# Patient Record
Sex: Male | Born: 1960 | Race: White | Hispanic: No | Marital: Single | Smoking: Former smoker
Health system: Southern US, Community
[De-identification: ages and names within clinical notes are randomized; demographics above are authoritative.]

## PROBLEM LIST (undated history)

## (undated) DIAGNOSIS — I10 Essential (primary) hypertension: Secondary | ICD-10-CM

## (undated) HISTORY — PX: FEMUR FRACTURE SURGERY: SHX633

## (undated) HISTORY — DX: Essential (primary) hypertension: I10

---

## 2012-02-23 ENCOUNTER — Ambulatory Visit (INDEPENDENT_AMBULATORY_CARE_PROVIDER_SITE_OTHER): Payer: BC Managed Care – PPO | Admitting: Family Medicine

## 2012-02-23 VITALS — BP 143/84 | HR 64 | Temp 98.3°F | Resp 18 | Ht 71.5 in | Wt 279.4 lb

## 2012-02-23 DIAGNOSIS — I10 Essential (primary) hypertension: Secondary | ICD-10-CM

## 2012-02-23 DIAGNOSIS — E789 Disorder of lipoprotein metabolism, unspecified: Secondary | ICD-10-CM

## 2012-02-23 DIAGNOSIS — E785 Hyperlipidemia, unspecified: Secondary | ICD-10-CM

## 2012-02-23 MED ORDER — METOPROLOL TARTRATE 50 MG PO TABS: 50.0000 mg | ORAL_TABLET | Freq: Two times a day (BID) | ORAL | Status: DC

## 2012-02-23 MED ORDER — LISINOPRIL-HYDROCHLOROTHIAZIDE 20-12.5 MG PO TABS: 2.0000 | ORAL_TABLET | Freq: Every day | ORAL | Status: DC

## 2012-02-23 MED ORDER — PRAVASTATIN SODIUM 20 MG PO TABS: 20.0000 mg | ORAL_TABLET | Freq: Every day | ORAL | Status: DC

## 2012-02-23 NOTE — Patient Instructions (Signed)
Continue same meds for now.  Work on weight loss with continuing exercise and diet changes.  Check blood pressures outside of office every 1-2 weeks.  If blood pressures remain over 140/90, may need to change your meds.   Follow up for physical in next 6 months.

## 2012-02-23 NOTE — Progress Notes (Signed)
  Subjective:    Patient ID: Jonathon Fischer, male    DOB: [birth year], 51 y.o.   MRN: [id number]  HPI Jonathon Fischer is a 51 y.o. 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 Weight up few pounds since last office visit.  Walking for exercise - few times per week.  Plans on new diet. Trying to lose weight.  Hyperlipidemia - no new side effects of statin, No new myalgias.  No recent knee pain.    Last po at 2 am.   Review of Systems  Constitutional: Negative for fatigue and unexpected weight change.  Eyes: Negative for visual disturbance.  Respiratory: Negative for cough, chest tightness and shortness of breath.   Cardiovascular: Negative for chest pain, palpitations and leg swelling.  Gastrointestinal: Negative for abdominal pain and blood in stool.  Musculoskeletal: Negative for myalgias and arthralgias.  Neurological: Negative for dizziness, light-headedness and headaches.       Objective:   Physical Exam  Constitutional: He is oriented to person, place, and time. He appears well-developed and well-nourished.  HENT:  Head: Normocephalic and atraumatic.  Eyes: EOM are normal. Pupils are equal, round, and reactive to light.  Neck: No JVD present. Carotid bruit is not present.  Cardiovascular: Normal rate, regular rhythm and normal heart sounds.   No murmur heard. Pulmonary/Chest: Effort normal and breath sounds normal. He has no rales.  Musculoskeletal: He exhibits no edema.  Neurological: He is alert and oriented to person, place, and time.  Skin: Skin is warm and dry.  Psychiatric: He has a normal mood and affect.          Assessment & Plan:  Jonathon Fischer is a 51 y.o. male 1. HTN (hypertension)  Comprehensive metabolic panel, Lipid panel  2. Hyperlipidemia  Comprehensive metabolic panel, Lipid panel   Borderline HTN - discussed overweight and need for weight loss and diet change.  If blood pressures are continuing to  be elevated, RTC for med change.  Check CMP, lipids.  Cont same med doses.

## 2012-02-24 LAB — COMPREHENSIVE METABOLIC PANEL
ALT: 28 U/L (ref 0–53)
AST: 20 U/L (ref 0–37)
Albumin: 4.9 g/dL (ref 3.5–5.2)
Alkaline Phosphatase: 34 U/L — ABNORMAL LOW (ref 39–117)
BUN: 18 mg/dL (ref 6–23)
CO2: 28 mEq/L (ref 19–32)
Calcium: 9.7 mg/dL (ref 8.4–10.5)
Chloride: 100 mEq/L (ref 96–112)
Creat: 1.1 mg/dL (ref 0.50–1.35)
Glucose, Bld: 93 mg/dL (ref 70–99)
Potassium: 4.5 mEq/L (ref 3.5–5.3)
Sodium: 138 mEq/L (ref 135–145)
Total Bilirubin: 0.7 mg/dL (ref 0.3–1.2)
Total Protein: 6.9 g/dL (ref 6.0–8.3)

## 2012-02-24 LAB — LIPID PANEL
Cholesterol: 220 mg/dL — ABNORMAL HIGH (ref 0–200)
HDL: 50 mg/dL (ref >39)
LDL Cholesterol: 131 mg/dL — ABNORMAL HIGH (ref 0–99)
Total CHOL/HDL Ratio: 4.4 Ratio
Triglycerides: 193 mg/dL — ABNORMAL HIGH (ref <150)
VLDL: 39 mg/dL (ref 0–40)

## 2012-08-07 ENCOUNTER — Other Ambulatory Visit: Payer: Self-pay | Admitting: Family Medicine

## 2012-09-07 ENCOUNTER — Ambulatory Visit: Payer: BC Managed Care – PPO | Admitting: Family Medicine

## 2012-09-07 VITALS — BP 132/86 | HR 54 | Temp 98.4°F | Resp 18 | Ht 72.75 in | Wt 267.0 lb

## 2012-09-07 DIAGNOSIS — I10 Essential (primary) hypertension: Secondary | ICD-10-CM

## 2012-09-07 DIAGNOSIS — E785 Hyperlipidemia, unspecified: Secondary | ICD-10-CM

## 2012-09-07 LAB — LIPID PANEL
Cholesterol: 156 mg/dL (ref 0–200)
HDL: 48 mg/dL (ref >39)
LDL Cholesterol: 82 mg/dL (ref 0–99)
Total CHOL/HDL Ratio: 3.3 Ratio
Triglycerides: 129 mg/dL (ref <150)
VLDL: 26 mg/dL (ref 0–40)

## 2012-09-07 LAB — COMPREHENSIVE METABOLIC PANEL
ALT: 25 U/L (ref 0–53)
AST: 21 U/L (ref 0–37)
Albumin: 4.6 g/dL (ref 3.5–5.2)
Alkaline Phosphatase: 31 U/L — ABNORMAL LOW (ref 39–117)
BUN: 20 mg/dL (ref 6–23)
CO2: 28 mEq/L (ref 19–32)
Calcium: 9.4 mg/dL (ref 8.4–10.5)
Chloride: 102 mEq/L (ref 96–112)
Creat: 0.99 mg/dL (ref 0.50–1.35)
Glucose, Bld: 89 mg/dL (ref 70–99)
Potassium: 4.7 mEq/L (ref 3.5–5.3)
Sodium: 138 mEq/L (ref 135–145)
Total Bilirubin: 0.6 mg/dL (ref 0.3–1.2)
Total Protein: 6.5 g/dL (ref 6.0–8.3)

## 2012-09-07 MED ORDER — METOPROLOL TARTRATE 50 MG PO TABS: ORAL_TABLET | ORAL | Status: DC

## 2012-09-07 MED ORDER — LISINOPRIL-HYDROCHLOROTHIAZIDE 20-12.5 MG PO TABS: 2.0000 | ORAL_TABLET | Freq: Every day | ORAL | Status: DC

## 2012-09-07 MED ORDER — PRAVASTATIN SODIUM 40 MG PO TABS: 40.0000 mg | ORAL_TABLET | Freq: Every day | ORAL | Status: DC

## 2012-09-07 NOTE — Patient Instructions (Addendum)
Continue same meds

## 2012-09-07 NOTE — Progress Notes (Signed)
Subjective:  Patient usually sees Dr. Chilton Si. He's here for followup of his blood pressure and cholesterol. He has no major acute complaints. Review of systems as per unremarkable. No chest pains or shortness of breath. He takes 40 mg of pravastatin since his last visit showed his cholesterol was still high.  Objective: TMs are occluded by wax on the left. On the right he has a hole in his TM. He apparently had tubes it came out several years ago. I suggested he consider seeing his ENT doctor back some time. Throat was clear. Neck supple without nodes or thyromegaly. Chest is clear to auscultation. Heart regular without murmurs gallops or arrhythmias. Abdomen soft. No ankle edema.  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

## 2012-10-10 ENCOUNTER — Other Ambulatory Visit: Payer: Self-pay | Admitting: Family Medicine

## 2013-03-02 ENCOUNTER — Ambulatory Visit (INDEPENDENT_AMBULATORY_CARE_PROVIDER_SITE_OTHER): Payer: BC Managed Care – PPO | Admitting: Family Medicine

## 2013-03-02 VITALS — BP 112/74 | HR 44 | Temp 98.5°F | Resp 18 | Ht 71.5 in | Wt 240.0 lb

## 2013-03-02 DIAGNOSIS — Z0000 Encounter for general adult medical examination without abnormal findings: Secondary | ICD-10-CM

## 2013-03-02 DIAGNOSIS — I498 Other specified cardiac arrhythmias: Secondary | ICD-10-CM

## 2013-03-02 DIAGNOSIS — R6889 Other general symptoms and signs: Secondary | ICD-10-CM

## 2013-03-02 DIAGNOSIS — Z1211 Encounter for screening for malignant neoplasm of colon: Secondary | ICD-10-CM

## 2013-03-02 DIAGNOSIS — E785 Hyperlipidemia, unspecified: Secondary | ICD-10-CM

## 2013-03-02 DIAGNOSIS — I10 Essential (primary) hypertension: Secondary | ICD-10-CM

## 2013-03-02 DIAGNOSIS — R001 Bradycardia, unspecified: Secondary | ICD-10-CM

## 2013-03-02 LAB — LIPID PANEL
Cholesterol: 150 mg/dL (ref 0–200)
HDL: 48 mg/dL (ref >39)
LDL Cholesterol: 83 mg/dL (ref 0–99)
Total CHOL/HDL Ratio: 3.1 Ratio
Triglycerides: 96 mg/dL (ref <150)
VLDL: 19 mg/dL (ref 0–40)

## 2013-03-02 LAB — COMPREHENSIVE METABOLIC PANEL
ALT: 28 U/L (ref 0–53)
AST: 27 U/L (ref 0–37)
Albumin: 4.3 g/dL (ref 3.5–5.2)
Alkaline Phosphatase: 28 U/L — ABNORMAL LOW (ref 39–117)
BUN: 27 mg/dL — ABNORMAL HIGH (ref 6–23)
CO2: 29 mEq/L (ref 19–32)
Calcium: 8.9 mg/dL (ref 8.4–10.5)
Chloride: 102 mEq/L (ref 96–112)
Creat: 0.82 mg/dL (ref 0.50–1.35)
Glucose, Bld: 93 mg/dL (ref 70–99)
Potassium: 4.4 mEq/L (ref 3.5–5.3)
Sodium: 137 mEq/L (ref 135–145)
Total Bilirubin: 0.5 mg/dL (ref 0.3–1.2)
Total Protein: 6.4 g/dL (ref 6.0–8.3)

## 2013-03-02 LAB — POCT CBC
Granulocyte percent: 66.6 %G (ref 37–80)
HCT, POC: 42.4 % — AB (ref 43.5–53.7)
Hemoglobin: 13.4 g/dL — AB (ref 14.1–18.1)
Lymph, poc: 1.7 (ref 0.6–3.4)
MCH, POC: 29.9 pg (ref 27–31.2)
MCHC: 31.6 g/dL — AB (ref 31.8–35.4)
MCV: 94.6 fL (ref 80–97)
MID (cbc): 0.6 (ref 0–0.9)
MPV: 9 fL (ref 0–99.8)
POC Granulocyte: 4.7 (ref 2–6.9)
POC LYMPH PERCENT: 24.8 %L (ref 10–50)
POC MID %: 8.6 %M (ref 0–12)
Platelet Count, POC: 172 10*3/uL (ref 142–424)
RBC: 4.48 M/uL — AB (ref 4.69–6.13)
RDW, POC: 13.3 %
WBC: 7 10*3/uL (ref 4.6–10.2)

## 2013-03-02 LAB — IFOBT (OCCULT BLOOD): IFOBT: NEGATIVE

## 2013-03-02 LAB — PSA: PSA: 1.14 ng/mL (ref <=4.00)

## 2013-03-02 LAB — TSH: TSH: 2.482 u[IU]/mL (ref 0.350–4.500)

## 2013-03-02 MED ORDER — PRAVASTATIN SODIUM 40 MG PO TABS: ORAL_TABLET | ORAL | Status: DC

## 2013-03-02 MED ORDER — LISINOPRIL-HYDROCHLOROTHIAZIDE 20-12.5 MG PO TABS: 2.0000 | ORAL_TABLET | Freq: Every day | ORAL | Status: DC

## 2013-03-02 NOTE — Progress Notes (Signed)
Subjective:    Patient ID: Jonathon Fischer, male    DOB: 1961-02-08, 52 y.o.   MRN: [id number]  HPI Jonathon Fischer is a 52 y.o. male Here for CPE.  Last ov in 08/2012.  Has not had colonoscopy.   No flu vaccine this year - never gets these.   last PSA 1.0 on 12/27/2009.   Has regular dentist - Q 6 months.   Results for orders placed in visit on 09/07/12  LIPID PANEL      Result Value Range   Cholesterol 156  0 - 200 mg/dL   Triglycerides 409  <811 mg/dL   HDL 48  >91 mg/dL   Total CHOL/HDL Ratio 3.3     VLDL 26  0 - 40 mg/dL   LDL Cholesterol 82  0 - 99 mg/dL  COMPREHENSIVE METABOLIC PANEL      Result Value Range   Sodium 138  135 - 145 mEq/L   Potassium 4.7  3.5 - 5.3 mEq/L   Chloride 102  96 - 112 mEq/L   CO2 28  19 - 32 mEq/L   Glucose, Bld 89  70 - 99 mg/dL   BUN 20  6 - 23 mg/dL   Creat 4.78  2.95 - 6.21 mg/dL   Total Bilirubin 0.6  0.3 - 1.2 mg/dL   Alkaline Phosphatase 31 (*) 39 - 117 U/L   AST 21  0 - 37 U/L   ALT 25  0 - 53 U/L   Total Protein 6.5  6.0 - 8.3 g/dL   Albumin 4.6  3.5 - 5.2 g/dL   Calcium 9.4  8.4 - 30.8 mg/dL   HTN - on metoprolol and zestoretic. No recent home Bp's. No lightheadedness. Occasionally dizzy before taking med, not after metoprolol. No cough.   Hyperlipidemia - prior labs as above.  Takes pravastatin 40mg  qd. Weight down from 267 in 08/2012. Has a treadmill.  Walking 5 days a week.also changing diet. Fasting this morning. No new myalgias.   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   Review of Systems  Constitutional: Negative for fever, chills, fatigue and unexpected weight change.  HENT: Positive for tinnitus (for years. has had hearing test at work that was normal.  hx of TM tube and remnant opening in TM.  no follow up. ).   Eyes: Negative for visual disturbance.  Respiratory: Negative for cough, chest tightness and shortness of breath.   Cardiovascular: Negative for chest pain, palpitations  and leg swelling.  Gastrointestinal: Negative for abdominal pain and blood in stool.  Neurological: Negative for light-headedness and headaches.  All other systems reviewed and are negative.       Objective:   Physical Exam  Vitals reviewed. Constitutional: He is oriented to person, place, and time. He appears well-developed and well-nourished.  HENT:  Head: Normocephalic and atraumatic.  Right Ear: External ear normal.  Left Ear: External ear normal.  Mouth/Throat: Oropharynx is clear and moist.  Cerumen nonobstructive bilat. Hole noted in R tm. No discharge.   Eyes: Conjunctivae and EOM are normal. Pupils are equal, round, and reactive to light.  Neck: Normal range of motion. Neck supple. No thyromegaly present.  Cardiovascular: Regular rhythm, normal heart sounds and intact distal pulses.   No extrasystoles are present. Bradycardia present.   Pulmonary/Chest: Effort normal and breath sounds normal. No respiratory distress. He has no wheezes.  Abdominal: Soft. He exhibits no distension. There is no tenderness.  Hernia confirmed negative in the right inguinal area and confirmed negative in the left inguinal area.  Genitourinary: Prostate normal.  Musculoskeletal: Normal range of motion. He exhibits no edema and no tenderness.  Lymphadenopathy:    He has no cervical adenopathy.  Neurological: He is alert and oriented to person, place, and time. He has normal reflexes.  Skin: Skin is warm and dry.  Psychiatric: He has a normal mood and affect. His behavior is normal.    EKG: SR, bradycardia - rate 53.  No apparent change from 12/2009  Results for orders placed in visit on 03/02/13  POCT CBC      Result Value Range   WBC 7.0  4.6 - 10.2 K/uL   Lymph, poc 1.7  0.6 - 3.4   POC LYMPH PERCENT 24.8  10 - 50 %L   MID (cbc) 0.6  0 - 0.9   POC MID % 8.6  0 - 12 %M   POC Granulocyte 4.7  2 - 6.9   Granulocyte percent 66.6  37 - 80 %G   RBC 4.48 (*) 4.69 - 6.13 M/uL   Hemoglobin 13.4  (*) 14.1 - 18.1 g/dL   HCT, POC 40.9 (*) 81.1 - 53.7 %   MCV 94.6  80 - 97 fL   MCH, POC 29.9  27 - 31.2 pg   MCHC 31.6 (*) 31.8 - 35.4 g/dL   RDW, POC 91.4     Platelet Count, POC 172  142 - 424 K/uL   MPV 9.0  0 - 99.8 fL  IFOBT (OCCULT BLOOD)      Result Value Range   IFOBT Negative         Assessment & Plan:   Jonathon Fischer is a 52 y.o. male  Annual physical exam - Plan: EKG 12-Lead, POCT CBC, TSH, Comprehensive metabolic panel, PSA, Lipid panel, IFOBT POC (occult bld, rslt in office), Ambulatory referral to Gastroenterology  Essential hypertension, benign - Plan: EKG 12-Lead, Comprehensive metabolic panel.  Well controlled, with weight loss and slightly bradycardic.  Will stop metoprolol.  Continue same dose of zestoretic. Check home bp's.   Other and unspecified hyperlipidemia - Plan: Lipid panel  Bradycardia - Plan: EKG 12-Lead, TSH  Special screening for malignant neoplasms, colon - Plan: Ambulatory referral to Gastroenterology  Borderline anemia - with negative hemosure.  Lab only visit for recehck in few weeks, and referred for coonoscopy.   At end of visit - asked about a bump behind his l ear - few days?.  Small erythematous area about 1 cm.  No fluctuance. Sx care, recheck in next few days if increasing in size.   Patient Instructions  We will refer you to a gastroenterologist for the colonoscopy.  Your blood count was borderline low - can recheck this as a lab only test in the next 3-4 weeks. Stop the metoprolol, but keep a record of your blood pressures outside of the office and bring them to the next office visit.  Recheck in 6 months, or sooner if blood pressures remaining above 140/90. Your should receive a call or letter about your lab results within the next week to 10 days.  Keeping you healthy  Get these tests  Blood pressure- Have your blood pressure checked once a year by your healthcare provider.  Normal blood pressure is 120/80  Weight- Have your  body mass index (BMI) calculated to screen for obesity.  BMI is a measure of body fat based on height and weight. You  can also calculate your own BMI at [email or web address].  Cholesterol- Have your cholesterol checked every year.  Diabetes- Have your blood sugar checked regularly if you have high blood pressure, high cholesterol, have a family history of diabetes or if you are overweight.  Screening for Colon Cancer- Colonoscopy starting at age 59.  Screening may begin sooner depending on your family history and other health conditions. Follow up colonoscopy as directed by your Gastroenterologist.  Screening for Prostate Cancer- Both blood work (PSA) and a rectal exam help screen for Prostate Cancer.  Screening begins at age 66 with African-American men and at age 70 with Caucasian men.  Screening may begin sooner depending on your family history.  Take these medicines  Aspirin- One aspirin daily can help prevent Heart disease and Stroke.  Flu shot- Every fall.  Tetanus- Every 10 years.  Zostavax- Once after the age of 51 to prevent Shingles.  Pneumonia shot- Once after the age of 86; if you are younger than 44, ask your healthcare provider if you need a Pneumonia shot.  Take these steps  Don't smoke- If you do smoke, talk to your doctor about quitting.  For tips on how to quit, go to www.smokefree.gov or call 1-800-QUIT-NOW.  Be physically active- Exercise 5 days a week for at least 30 minutes.  If you are not already physically active start slow and gradually work up to 30 minutes of moderate physical activity.  Examples of moderate activity include walking briskly, mowing the yard, dancing, swimming, bicycling, etc.  Eat a healthy diet- Eat a variety of healthy food such as fruits, vegetables, low fat milk, low fat cheese, yogurt, lean meant, poultry, fish, beans, tofu, etc. For more information go to www.thenutritionsource.org  Drink alcohol in moderation- Limit alcohol intake  to less than two drinks a day. Never drink and drive.  Dentist- Brush and floss twice daily; visit your dentist twice a year.  Depression- Your emotional health is as important as your physical health. If you're feeling down, or losing interest in things you would normally enjoy please talk to your healthcare provider.  Eye exam- Visit your eye doctor every year.  Safe sex- If you may be exposed to a sexually transmitted infection, use a condom.  Seat belts- Seat belts can save your life; always wear one.  Smoke/Carbon Monoxide detectors- These detectors need to be installed on the appropriate level of your home.  Replace batteries at least once a year.  Skin cancer- When out in the sun, cover up and use sunscreen 15 SPF or higher.  Violence- If anyone is threatening you, please tell your healthcare provider.  Living Will/ Health care power of attorney- Speak with your healthcare provider and family.

## 2013-03-02 NOTE — Patient Instructions (Addendum)
We will refer you to a gastroenterologist for the colonoscopy.  Your blood count was borderline low - can recheck this as a lab only test in the next 3-4 weeks. Stop the metoprolol, but keep a record of your blood pressures outside of the office and bring them to the next office visit.  Recheck in 6 months, or sooner if blood pressures remaining above 140/90. Your should receive a call or letter about your lab results within the next week to 10 days.  Return for BLOODWORK only in 3-4 weeks. The order is already is in the system.   Keeping you healthy  Get these tests  Blood pressure- Have your blood pressure checked once a year by your healthcare provider.  Normal blood pressure is 120/80  Weight- Have your body mass index (BMI) calculated to screen for obesity.  BMI is a measure of body fat based on height and weight. You can also calculate your own BMI at [email or web address].  Cholesterol- Have your cholesterol checked every year.  Diabetes- Have your blood sugar checked regularly if you have high blood pressure, high cholesterol, have a family history of diabetes or if you are overweight.  Screening for Colon Cancer- Colonoscopy starting at age 54.  Screening may begin sooner depending on your family history and other health conditions. Follow up colonoscopy as directed by your Gastroenterologist.  Screening for Prostate Cancer- Both blood work (PSA) and a rectal exam help screen for Prostate Cancer.  Screening begins at age 34 with African-American men and at age 17 with Caucasian men.  Screening may begin sooner depending on your family history.  Take these medicines  Aspirin- One aspirin daily can help prevent Heart disease and Stroke.  Flu shot- Every fall.  Tetanus- Every 10 years.  Zostavax- Once after the age of 75 to prevent Shingles.  Pneumonia shot- Once after the age of 83; if you are younger than 32, ask your healthcare provider if you need a Pneumonia shot.  Take  these steps  Don't smoke- If you do smoke, talk to your doctor about quitting.  For tips on how to quit, go to www.smokefree.gov or call 1-800-QUIT-NOW.  Be physically active- Exercise 5 days a week for at least 30 minutes.  If you are not already physically active start slow and gradually work up to 30 minutes of moderate physical activity.  Examples of moderate activity include walking briskly, mowing the yard, dancing, swimming, bicycling, etc.  Eat a healthy diet- Eat a variety of healthy food such as fruits, vegetables, low fat milk, low fat cheese, yogurt, lean meant, poultry, fish, beans, tofu, etc. For more information go to www.thenutritionsource.org  Drink alcohol in moderation- Limit alcohol intake to less than two drinks a day. Never drink and drive.  Dentist- Brush and floss twice daily; visit your dentist twice a year.  Depression- Your emotional health is as important as your physical health. If you're feeling down, or losing interest in things you would normally enjoy please talk to your healthcare provider.  Eye exam- Visit your eye doctor every year.  Safe sex- If you may be exposed to a sexually transmitted infection, use a condom.  Seat belts- Seat belts can save your life; always wear one.  Smoke/Carbon Monoxide detectors- These detectors need to be installed on the appropriate level of your home.  Replace batteries at least once a year.  Skin cancer- When out in the sun, cover up and use sunscreen 15 SPF or higher.  Violence- If  anyone is threatening you, please tell your healthcare provider.  Living Will/ Health care power of attorney- Speak with your healthcare provider and family.

## 2013-08-29 ENCOUNTER — Other Ambulatory Visit: Payer: Self-pay | Admitting: Family Medicine

## 2013-10-12 ENCOUNTER — Ambulatory Visit (INDEPENDENT_AMBULATORY_CARE_PROVIDER_SITE_OTHER): Payer: BC Managed Care – PPO | Admitting: Family Medicine

## 2013-10-12 ENCOUNTER — Ambulatory Visit (INDEPENDENT_AMBULATORY_CARE_PROVIDER_SITE_OTHER): Payer: BC Managed Care – PPO | Admitting: General Surgery

## 2013-10-12 ENCOUNTER — Encounter (INDEPENDENT_AMBULATORY_CARE_PROVIDER_SITE_OTHER): Payer: Self-pay | Admitting: General Surgery

## 2013-10-12 VITALS — BP 118/72 | HR 71 | Temp 99.4°F | Resp 18 | Ht 71.0 in | Wt 217.0 lb

## 2013-10-12 VITALS — BP 124/78 | HR 72 | Temp 99.2°F | Resp 14 | Ht 72.0 in | Wt 220.4 lb

## 2013-10-12 DIAGNOSIS — L03119 Cellulitis of unspecified part of limb: Secondary | ICD-10-CM

## 2013-10-12 DIAGNOSIS — D649 Anemia, unspecified: Secondary | ICD-10-CM

## 2013-10-12 DIAGNOSIS — N498 Inflammatory disorders of other specified male genital organs: Secondary | ICD-10-CM

## 2013-10-12 DIAGNOSIS — Z0000 Encounter for general adult medical examination without abnormal findings: Secondary | ICD-10-CM

## 2013-10-12 DIAGNOSIS — E785 Hyperlipidemia, unspecified: Secondary | ICD-10-CM

## 2013-10-12 DIAGNOSIS — N492 Inflammatory disorders of scrotum: Secondary | ICD-10-CM

## 2013-10-12 DIAGNOSIS — I10 Essential (primary) hypertension: Secondary | ICD-10-CM

## 2013-10-12 DIAGNOSIS — L02419 Cutaneous abscess of limb, unspecified: Secondary | ICD-10-CM

## 2013-10-12 LAB — POCT CBC
Granulocyte percent: 65.5 %G (ref 37–80)
HCT, POC: 51.9 % (ref 43.5–53.7)
Hemoglobin: 16.9 g/dL (ref 14.1–18.1)
Lymph, poc: 2 (ref 0.6–3.4)
MCH, POC: 31.9 pg — AB (ref 27–31.2)
MCHC: 32.6 g/dL (ref 31.8–35.4)
MCV: 98.2 fL — AB (ref 80–97)
MID (cbc): 0.5 (ref 0–0.9)
MPV: 9 fL (ref 0–99.8)
POC Granulocyte: 4.8 (ref 2–6.9)
POC LYMPH PERCENT: 27.7 %L (ref 10–50)
POC MID %: 6.8 %M (ref 0–12)
Platelet Count, POC: 215 10*3/uL (ref 142–424)
RBC: 5.29 M/uL (ref 4.69–6.13)
RDW, POC: 13.6 %
WBC: 7.4 10*3/uL (ref 4.6–10.2)

## 2013-10-12 LAB — LIPID PANEL
Cholesterol: 199 mg/dL (ref 0–200)
HDL: 84 mg/dL (ref >39)
LDL Cholesterol: 83 mg/dL (ref 0–99)
Total CHOL/HDL Ratio: 2.4 Ratio
Triglycerides: 162 mg/dL — ABNORMAL HIGH (ref <150)
VLDL: 32 mg/dL (ref 0–40)

## 2013-10-12 LAB — COMPREHENSIVE METABOLIC PANEL
ALT: 21 U/L (ref 0–53)
AST: 21 U/L (ref 0–37)
Albumin: 4.6 g/dL (ref 3.5–5.2)
Alkaline Phosphatase: 31 U/L — ABNORMAL LOW (ref 39–117)
BUN: 18 mg/dL (ref 6–23)
CO2: 25 mEq/L (ref 19–32)
Calcium: 9.6 mg/dL (ref 8.4–10.5)
Chloride: 101 mEq/L (ref 96–112)
Creat: 0.69 mg/dL (ref 0.50–1.35)
Glucose, Bld: 80 mg/dL (ref 70–99)
Potassium: 4.7 mEq/L (ref 3.5–5.3)
Sodium: 137 mEq/L (ref 135–145)
Total Bilirubin: 0.6 mg/dL (ref 0.3–1.2)
Total Protein: 6.9 g/dL (ref 6.0–8.3)

## 2013-10-12 MED ORDER — PRAVASTATIN SODIUM 40 MG PO TABS: ORAL_TABLET | ORAL | Status: DC

## 2013-10-12 MED ORDER — DOXYCYCLINE HYCLATE 100 MG PO TABS: 100.0000 mg | ORAL_TABLET | Freq: Two times a day (BID) | ORAL | Status: DC

## 2013-10-12 MED ORDER — LISINOPRIL-HYDROCHLOROTHIAZIDE 20-12.5 MG PO TABS: 2.0000 | ORAL_TABLET | Freq: Every day | ORAL | Status: DC

## 2013-10-12 NOTE — Patient Instructions (Signed)
Home Instructions Following Incision and Drainage of Perirectal Abscess  Wound care - A dressing has been applied to control any bleeding or drainage immediately after your procedure.  You may remove this dressing at your first bowel movement or tomorrow morning, whichever comes first.  There may be packing inside your wound as well that should be removed with the dressing.  You do not need to repack the area.  After the dressing is removed, clean the area gently with a mild soap and warm water and place a piece of 100% cotton over the area.  Change to cotton ever 1-3 hours while awake to keep the area clean and dry.   - Beginning tomorrow, sit in a tub of warm water for 15-20 minutes at least twice a day and after bowel movements.  This will help with healing, pain and discomfort. - A small amount of bleeding is to be expected.  If you notice an increase in the bleeding, place a large piece of cotton (about the size of a golf ball) next to the anal opening and sit on a hard surface for 15 minutes.  If the bleeding persists or if you are concerned, please call the office.  Do not sit on rubber rings.  Instead, sit on a soft pillow.    Diet -Eat a regular diet.  Avoid foods that may constipate you or give you diarrhea.  Drink 6-8 glasses of water a day and avoid seeds, nuts and popcorn until the area heals.  Medication -Take pain medication as directed.  Do not drive or operate machinery if you are taking a prescription pain medication.   - We recommend Extra Strength Tylenol for mild to moderate pain.  This can be taken as instructed on the bottle.   - If you are given a prescription for antibiotics, take as instructed by your doctor until the entire course is completed  Activity Resume activities as tolerated beginning tomorrow.  Avoid strenuous activities or sports for one week.    Call the office if you have any questions.  Call IMMEDIATELY if you should develop persistent heavy rectal  bleeding, increase in pain, difficulty urinating or fever greater than 100 F.

## 2013-10-12 NOTE — Patient Instructions (Addendum)
Good work on the weight loss and exercise!  Blood pressure looks good today. Keep a record of your blood pressures outside of the office and bring them to the next office visit, and if conistently in 110's - may be able to decrease dose of blood pressure medicine to 1 pill per day and watch blood pressure readings closely to make sure they do not go up.  You should receive a call or letter about your lab results within the next week to 10 days, and if cholesterol is elevated - I sent in a refill of your pravastatin.  See [facility] at 2:45pm today for possible infected area in groin. Return to the clinic or go to the nearest emergency room if any of your symptoms worsen or new symptoms occur.  [facility] [phone] [address] [place], [place] [address] phone: [phone]

## 2013-10-12 NOTE — Progress Notes (Addendum)
Subjective:    Patient ID: Jonathon Fischer, male    DOB: 10-01-1961, 52 y.o.   MRN: [id number]  HPI Comments: Jonathon Fischer is a 52 y.o. male who presents to the [facility] complaining of a medication refill for hypertension. Pt is also f/u for a hyperlipidemia check up.  Hyperlipidemia - last lipid panel in March.  LDL of 83 and HDL of 48,  triglycerides  96. His cnp overall is within normal limits. Pt lab work shows he is borderline anemic. Pt states he ran out of medication a few months ago and has been too busy to refill it. He reports his last meal being at 8p last night. Pt weight today is 217, down from 240 at last visit. Pt reports using the treadmill 3-4 days a week along dieting to encourage weight loss.   HTN - taking 2 of zestoretic each day. No new side effects. His bp today is 118/72 today which is similar to outside readings. No orthostasis, lightheadedness or dizziness. He reports work and home being the same.  Additional problem with scribe out of room: Cyst to R of scrotum for past few months.  Swelling comes and goes and has drained fluid at times.  Feels about same past few days. No fever, min discomfort. No attempted treatments. Has had some cysts in past in other areas including one behind L ear that drained on it's own in March.   HPI    Review of Systems  Constitutional: Negative for fever, chills, fatigue and unexpected weight change.  Eyes: Negative for visual disturbance.  Respiratory: Negative for cough, chest tightness and shortness of breath.   Cardiovascular: Negative for chest pain, palpitations and leg swelling.  Gastrointestinal: Negative for abdominal pain and blood in stool.  Skin:       cyst R groin.   Neurological: Negative for dizziness, light-headedness and headaches.     Objective:   Physical Exam  Constitutional: He is oriented to person, place, and time. He appears well-developed and well-nourished.  HENT:  Head:  Normocephalic and atraumatic.  Eyes: EOM are normal. Pupils are equal, round, and reactive to light.  Neck: No JVD present. Carotid bruit is not present.  Cardiovascular: Normal rate, regular rhythm and normal heart sounds.   No murmur heard. Pulmonary/Chest: Effort normal and breath sounds normal. He has no rales.  Genitourinary: Testes normal.    Right testis shows no tenderness. Left testis shows no tenderness. No discharge found.  Musculoskeletal: He exhibits no edema.  Neurological: He is alert and oriented to person, place, and time.  Skin: Skin is warm and dry.  Psychiatric: He has a normal mood and affect. His behavior is normal.   Results for orders placed in visit on 10/12/13  POCT CBC      Result Value Range   WBC 7.4  4.6 - 10.2 K/uL   Lymph, poc 2.0  0.6 - 3.4   POC LYMPH PERCENT 27.7  10 - 50 %L   MID (cbc) 0.5  0 - 0.9   POC MID % 6.8  0 - 12 %M   POC Granulocyte 4.8  2 - 6.9   Granulocyte percent 65.5  37 - 80 %G   RBC 5.29  4.69 - 6.13 M/uL   Hemoglobin 16.9  14.1 - 18.1 g/dL   HCT, POC 40.9  81.1 - 53.7 %   MCV 98.2 (*) 80 - 97 fL   MCH, POC 31.9 (*) 27 -  31.2 pg   MCHC 32.6  31.8 - 35.4 g/dL   RDW, POC 16.1     Platelet Count, POC 215  142 - 424 K/uL   MPV 9.0  0 - 99.8 fL       Assessment & Plan:   Jonathon Fischer is a 52 y.o. male HTN (hypertension) - Plan: Comprehensive metabolic panel, Lipid panel, lisinopril-hydrochlorothiazide (PRINZIDE,ZESTORETIC) 20-12.5 MG per tablet - improved with weight loss. Can try to decr o 1 pill per day, but if BP elevates, return to 2 per day as below.   Other and unspecified hyperlipidemia - Plan: Comprehensive metabolic panel, Lipid panel, pravastatin (PRAVACHOL) 40 MG tablet - controlled prior.  Off meds recently, but with weight loss - will check lipids prior to restart pravastatin, but it was sent if restart needed.   Anemia - Plan: POCT CBC - normal today. Recommended colonoscopy again.   Scrotal abscess - Plan:  POCT CBC, Wound culture, Ambulatory referral to General Surgery - to be seen today.  Possible infected cyst based on timing.    Meds ordered this encounter  Medications  . DISCONTD: doxycycline (VIBRA-TABS) 100 MG tablet    Sig: Take 1 tablet (100 mg total) by mouth 2 (two) times daily.    Dispense:  20 tablet    Refill:  0  . pravastatin (PRAVACHOL) 40 MG tablet    Sig: TAKE ONE TABLET BY MOUTH AT BEDTIME    Dispense:  90 tablet    Refill:  1  . lisinopril-hydrochlorothiazide (PRINZIDE,ZESTORETIC) 20-12.5 MG per tablet    Sig: Take 2 tablets by mouth daily.    Dispense:  180 tablet    Refill:  1   Patient Instructions  Good work on the weight loss and exercise!  Blood pressure looks good today. Keep a record of your blood pressures outside of the office and bring them to the next office visit, and if conistently in 110's - may be able to decrease dose of blood pressure medicine to 1 pill per day and watch blood pressure readings closely to make sure they do not go up.  You should receive a call or letter about your lab results within the next week to 10 days, and if cholesterol is elevated - I sent in a refill of your pravastatin.  See [facility] at 2:45pm today for possible infected area in groin. Return to the clinic or go to the nearest emergency room if any of your symptoms worsen or new symptoms occur.  [facility] [phone] [address] [place], [place] [address] phone: [phone]      I personally performed the services described in this documentation, which was scribed in my presence. The recorded information has been reviewed and considered, and addended by me as needed.

## 2013-10-12 NOTE — Progress Notes (Signed)
Chief Complaint  Patient presents with  . Follow-up    abscess groin    HISTORY:  Jonathon Fischer is a 52 y.o. male who presents to the office with a draining mass in his R groin.  This has been there for a couple months, but has been sore for the past couple weeks.  He denies fevers or excessive pain.  Past Medical History  Diagnosis Date  . Hypertension        Past Surgical History  Procedure Laterality Date  . Femur fracture surgery      right      Current Outpatient Prescriptions  Medication Sig Dispense Refill  . aspirin 325 MG tablet Take 325 mg by mouth daily.      Marland Kitchen lisinopril-hydrochlorothiazide (PRINZIDE,ZESTORETIC) 20-12.5 MG per tablet Take 2 tablets by mouth daily.  180 tablet  1  . pravastatin (PRAVACHOL) 40 MG tablet TAKE ONE TABLET BY MOUTH AT BEDTIME  90 tablet  1   No current facility-administered medications for this visit.     No Known Allergies    Family History  Problem Relation Age of Onset  . Cancer Maternal Grandfather     leukemia      History   Social History  . Marital Status: Single    Spouse Name: N/A    Number of Children: N/A  . Years of Education: N/A   Social History Main Topics  . Smoking status: Former Smoker    Quit date: 09/07/2002  . Smokeless tobacco: None  . Alcohol Use: 6.0 oz/week    12 drink(s) per week  . Drug Use: No  . Sexual Activity: None   Other Topics Concern  . None   Social History Narrative  . None       REVIEW OF SYSTEMS - PERTINENT POSITIVES ONLY: Review of Systems - General ROS: negative for - chills or fever Respiratory ROS: no cough, shortness of breath, or wheezing Cardiovascular ROS: no chest pain or dyspnea on exertion Gastrointestinal ROS: no abdominal pain, change in bowel habits, or black or bloody stools Genito-Urinary ROS: no dysuria, trouble voiding, or hematuria    EXAM: Filed Vitals:   10/12/13 1518  BP: 124/78  Pulse: 72  Temp: 99.2 F (37.3 C)  Resp: 14    General  appearance: alert and cooperative Resp: clear to auscultation bilaterally Cardio: regular rate and rhythm GI: soft, non-tender; bowel sounds normal; no masses,  no organomegaly  Fluctuant mass in R groin, no surrounding cellulitis.  Draining slightly cloudy fluid.  No lypmhadenopathy, does not appear to involve scrotum, no testicular pain.  Procedure: I&D Surgeon: Maisie Fus Assistant: Christella Scheuermann After the risks and benefits were explained, verbal consent was obtained for above procedure  Anesthesia: none Diagnosis: R groin abscess Findings: Area sterilized.  Cruciate incision made with 15 blade scalpel and corners trimmed.  Golf ball sized cavity evacuated.  Hemostasis achieved with direct pressure.  Dry dressing applied.     ASSESSMENT AND PLAN:  Jonathon Fischer is a 52 y.o. M who is here for a R groin abscess.  This was incised in the office.  I will see him back in 3 weeks.  If it does not resolve or returns, he should probably see urology to evaluate for scrotal involvement.    Vanita Panda, MD Colon and Rectal Surgery / General Surgery [facility]      Visit Diagnoses: 1. Cellulitis and abscess of leg     Primary Care Physician:  GUEST, Loretha Stapler, MD

## 2013-10-14 LAB — WOUND CULTURE
Gram Stain: NONE SEEN
Gram Stain: NONE SEEN

## 2013-10-16 ENCOUNTER — Telehealth: Payer: Self-pay

## 2013-10-16 NOTE — Telephone Encounter (Signed)
PT STATES THE LAB CALLED HIM YESTERDAY AND HE IS RETURNING THE CALL. PLEASE CALL [phone]

## 2013-10-16 NOTE — Telephone Encounter (Signed)
Notes Recorded by Shade Flood, MD on 10/15/2013 at 11:19 AM Call patient - wound culture did not grow out one specific organism. Did things go ok at the general surgeon appointment? I see that they were able to cut into the abscess, so this should improve.  Additionally, cholesterol numbers look overall pretty good off of the chol med, which is likely due to his efforts in weight loss. Triglycerides are mildly elevated, but the other numbers look good, so can hold off on the pravastatin for now and keep up the good work. Recheck levels in 6 months. Electrolytes ok otherwise. Let me know if any questions.      Called him. Left message, see lab.

## 2013-10-30 ENCOUNTER — Ambulatory Visit (INDEPENDENT_AMBULATORY_CARE_PROVIDER_SITE_OTHER): Payer: BC Managed Care – PPO | Admitting: General Surgery

## 2013-10-30 ENCOUNTER — Encounter (INDEPENDENT_AMBULATORY_CARE_PROVIDER_SITE_OTHER): Payer: Self-pay | Admitting: General Surgery

## 2013-10-30 VITALS — BP 128/76 | HR 66 | Temp 97.0°F | Resp 14 | Ht 72.0 in | Wt 224.6 lb

## 2013-10-30 DIAGNOSIS — L02219 Cutaneous abscess of trunk, unspecified: Secondary | ICD-10-CM

## 2013-10-30 DIAGNOSIS — Z9889 Other specified postprocedural states: Secondary | ICD-10-CM

## 2013-10-30 NOTE — Patient Instructions (Signed)
Keep the area covered with a dry dressing.

## 2013-10-30 NOTE — Progress Notes (Signed)
Jonathon Fischer is a 52 y.o. male who is here for a follow up visit regarding his groin abscess.  He is healing slowly.  He denies any pain or bleeding.  He is using H2O2 on this daily.  Objective: Filed Vitals:   10/30/13 1207  BP: 128/76  Pulse: 66  Temp: 97 F (36.1 C)  Resp: 14    General appearance: alert and cooperative still with a nickel sized defect, good granulation tissue.    Assessment and Plan: I recommended keeping a dry dressing on this at all times.  He should stop the H2O2.  I will see him back in 3 weeks to ensure the area has healed.      Vanita Panda, MD [facility], [place] [phone]

## 2013-10-31 ENCOUNTER — Encounter (INDEPENDENT_AMBULATORY_CARE_PROVIDER_SITE_OTHER): Payer: BC Managed Care – PPO | Admitting: General Surgery

## 2013-11-20 ENCOUNTER — Encounter (INDEPENDENT_AMBULATORY_CARE_PROVIDER_SITE_OTHER): Payer: BC Managed Care – PPO | Admitting: General Surgery

## 2014-05-01 ENCOUNTER — Ambulatory Visit (INDEPENDENT_AMBULATORY_CARE_PROVIDER_SITE_OTHER): Payer: BC Managed Care – PPO | Admitting: Family Medicine

## 2014-05-01 ENCOUNTER — Encounter (HOSPITAL_COMMUNITY): Payer: Self-pay | Admitting: Radiology

## 2014-05-01 ENCOUNTER — Inpatient Hospital Stay (HOSPITAL_COMMUNITY)
Admission: EM | Admit: 2014-05-01 | Discharge: 2014-05-04 | DRG: 982 | Disposition: A | Discharge status: Home / Self Care | Payer: BC Managed Care – PPO | Attending: General Surgery | Admitting: General Surgery

## 2014-05-01 ENCOUNTER — Emergency Department (HOSPITAL_COMMUNITY): Payer: BC Managed Care – PPO

## 2014-05-01 ENCOUNTER — Inpatient Hospital Stay (HOSPITAL_COMMUNITY): Payer: BC Managed Care – PPO

## 2014-05-01 DIAGNOSIS — R4020 Unspecified coma: Secondary | ICD-10-CM

## 2014-05-01 DIAGNOSIS — R55 Syncope and collapse: Secondary | ICD-10-CM

## 2014-05-01 DIAGNOSIS — S3600XA Unspecified injury of spleen, initial encounter: Principal | ICD-10-CM | POA: Diagnosis present

## 2014-05-01 DIAGNOSIS — R112 Nausea with vomiting, unspecified: Secondary | ICD-10-CM

## 2014-05-01 DIAGNOSIS — S36039A Unspecified laceration of spleen, initial encounter: Secondary | ICD-10-CM | POA: Diagnosis present

## 2014-05-01 DIAGNOSIS — S20219A Contusion of unspecified front wall of thorax, initial encounter: Secondary | ICD-10-CM | POA: Diagnosis present

## 2014-05-01 DIAGNOSIS — R079 Chest pain, unspecified: Secondary | ICD-10-CM

## 2014-05-01 DIAGNOSIS — R0781 Pleurodynia: Secondary | ICD-10-CM

## 2014-05-01 DIAGNOSIS — S3609XA Other injury of spleen, initial encounter: Secondary | ICD-10-CM

## 2014-05-01 DIAGNOSIS — S40022A Contusion of left upper arm, initial encounter: Secondary | ICD-10-CM | POA: Diagnosis present

## 2014-05-01 DIAGNOSIS — D62 Acute posthemorrhagic anemia: Secondary | ICD-10-CM | POA: Diagnosis not present

## 2014-05-01 DIAGNOSIS — R404 Transient alteration of awareness: Secondary | ICD-10-CM

## 2014-05-01 LAB — CBC WITH DIFFERENTIAL/PLATELET
Basophils Absolute: 0 10*3/uL (ref 0.0–0.1)
Basophils Relative: 0 % (ref 0–1)
Eosinophils Absolute: 0 10*3/uL (ref 0.0–0.7)
Eosinophils Relative: 0 % (ref 0–5)
HCT: 37.4 % — ABNORMAL LOW (ref 39.0–52.0)
Hemoglobin: 13 g/dL (ref 13.0–17.0)
Lymphocytes Relative: 5 % — ABNORMAL LOW (ref 12–46)
Lymphs Abs: 0.7 10*3/uL (ref 0.7–4.0)
MCH: 31.3 pg (ref 26.0–34.0)
MCHC: 34.8 g/dL (ref 30.0–36.0)
MCV: 90.1 fL (ref 78.0–100.0)
Monocytes Absolute: 1.2 10*3/uL — ABNORMAL HIGH (ref 0.1–1.0)
Monocytes Relative: 9 % (ref 3–12)
Neutro Abs: 12.1 10*3/uL — ABNORMAL HIGH (ref 1.7–7.7)
Neutrophils Relative %: 86 % — ABNORMAL HIGH (ref 43–77)
Platelets: 185 10*3/uL (ref 150–400)
RBC: 4.15 MIL/uL — ABNORMAL LOW (ref 4.22–5.81)
RDW: 12.5 % (ref 11.5–15.5)
WBC: 14 10*3/uL — ABNORMAL HIGH (ref 4.0–10.5)

## 2014-05-01 LAB — COMPREHENSIVE METABOLIC PANEL
ALT: 18 U/L (ref 0–53)
AST: 22 U/L (ref 0–37)
Albumin: 3.9 g/dL (ref 3.5–5.2)
Alkaline Phosphatase: 29 U/L — ABNORMAL LOW (ref 39–117)
BUN: 21 mg/dL (ref 6–23)
CO2: 23 mEq/L (ref 19–32)
Calcium: 8.6 mg/dL (ref 8.4–10.5)
Chloride: 104 mEq/L (ref 96–112)
Creatinine, Ser: 0.76 mg/dL (ref 0.50–1.35)
GFR calc Af Amer: >90 mL/min (ref >90)
GFR calc non Af Amer: >90 mL/min (ref >90)
Glucose, Bld: 112 mg/dL — ABNORMAL HIGH (ref 70–99)
Potassium: 4.3 mEq/L (ref 3.7–5.3)
Sodium: 139 mEq/L (ref 137–147)
Total Bilirubin: 0.4 mg/dL (ref 0.3–1.2)
Total Protein: 6.2 g/dL (ref 6.0–8.3)

## 2014-05-01 LAB — URINE MICROSCOPIC-ADD ON

## 2014-05-01 LAB — I-STAT CG4 LACTIC ACID, ED: Lactic Acid, Venous: 1.64 mmol/L (ref 0.5–2.2)

## 2014-05-01 LAB — URINALYSIS, ROUTINE W REFLEX MICROSCOPIC
Bilirubin Urine: NEGATIVE
Glucose, UA: NEGATIVE mg/dL
Ketones, ur: NEGATIVE mg/dL
Nitrite: NEGATIVE
Protein, ur: 100 mg/dL — AB
Specific Gravity, Urine: 1.025 (ref 1.005–1.030)
Urobilinogen, UA: 1 mg/dL (ref 0.0–1.0)
pH: 5.5 (ref 5.0–8.0)

## 2014-05-01 LAB — ABO/RH: ABO/RH(D): A POS

## 2014-05-01 LAB — I-STAT TROPONIN, ED: Troponin i, poc: 0 ng/mL (ref 0.00–0.08)

## 2014-05-01 LAB — TYPE AND SCREEN
ABO/RH(D): A POS
Antibody Screen: NEGATIVE

## 2014-05-01 LAB — GLUCOSE, POCT (MANUAL RESULT ENTRY): POC Glucose: 191 mg/dl — AB (ref 70–99)

## 2014-05-01 MED ORDER — SODIUM CHLORIDE 0.9 % IV BOLUS (SEPSIS)
1000.0000 mL | Freq: Once | INTRAVENOUS | Status: AC
  Administered 2014-05-01: 1000 mL via INTRAVENOUS

## 2014-05-01 MED ORDER — FENTANYL CITRATE 0.05 MG/ML IJ SOLN
50.0000 ug | Freq: Once | INTRAMUSCULAR | Status: DC
  Filled 2014-05-01: qty 2

## 2014-05-01 MED ORDER — FENTANYL CITRATE 0.05 MG/ML IJ SOLN
INTRAMUSCULAR | Status: AC
Start: 2014-05-01 — End: 2014-05-02
  Filled 2014-05-01: qty 4

## 2014-05-01 MED ORDER — MIDAZOLAM HCL 2 MG/2ML IJ SOLN
INTRAMUSCULAR | Status: AC | PRN
  Administered 2014-05-01 (×2): 2 mg via INTRAVENOUS
  Administered 2014-05-01 (×2): 1 mg via INTRAVENOUS

## 2014-05-01 MED ORDER — IOHEXOL 300 MG/ML  SOLN
150.0000 mL | Freq: Once | INTRAMUSCULAR | Status: AC | PRN
  Administered 2014-05-01: 100 mL via INTRAVENOUS

## 2014-05-01 MED ORDER — ONDANSETRON HCL 4 MG/2ML IJ SOLN
4.0000 mg | Freq: Once | INTRAMUSCULAR | Status: DC
  Filled 2014-05-01: qty 2

## 2014-05-01 MED ORDER — LISINOPRIL-HYDROCHLOROTHIAZIDE 20-12.5 MG PO TABS: 2.0000 | ORAL_TABLET | Freq: Every day | ORAL | Status: DC

## 2014-05-01 MED ORDER — IOHEXOL 300 MG/ML  SOLN
100.0000 mL | Freq: Once | INTRAMUSCULAR | Status: AC | PRN
  Administered 2014-05-01: 100 mL via INTRAVENOUS

## 2014-05-01 MED ORDER — MIDAZOLAM HCL 2 MG/2ML IJ SOLN
INTRAMUSCULAR | Status: AC
  Filled 2014-05-01: qty 2

## 2014-05-01 MED ORDER — HYDROMORPHONE HCL PF 1 MG/ML IJ SOLN
INTRAMUSCULAR | Status: AC
  Filled 2014-05-01: qty 2

## 2014-05-01 MED ORDER — FENTANYL CITRATE 0.05 MG/ML IJ SOLN
INTRAMUSCULAR | Status: AC | PRN
  Administered 2014-05-01 (×4): 50 ug via INTRAVENOUS

## 2014-05-01 MED ORDER — MIDAZOLAM HCL 2 MG/2ML IJ SOLN
INTRAMUSCULAR | Status: AC
  Filled 2014-05-01: qty 4

## 2014-05-01 NOTE — Addendum Note (Signed)
Addended by: Fara Chute on: 05/01/2014 05:49 PM   Modules accepted: Level of Service
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
## 2014-05-01 NOTE — Procedures (Signed)
Post Splenic arteriogram and embolization.  No immediate complications.  Keep right leg straight for 4 hrs.
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
## 2014-05-01 NOTE — ED Notes (Signed)
No new bleeding to dressing on right groin area

## 2014-05-01 NOTE — ED Notes (Addendum)
No new bleeding noted to right groin dressing, pressure dressing remains
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
## 2014-05-01 NOTE — ED Notes (Signed)
Pt to IR at this time, report to RN at bedside

## 2014-05-01 NOTE — ED Notes (Signed)
CG-4 shown to Dr. Fredric Dine
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
## 2014-05-02 ENCOUNTER — Encounter (HOSPITAL_COMMUNITY): Payer: Self-pay | Admitting: Emergency Medicine

## 2014-05-02 DIAGNOSIS — D62 Acute posthemorrhagic anemia: Secondary | ICD-10-CM | POA: Diagnosis not present

## 2014-05-02 DIAGNOSIS — S40022A Contusion of left upper arm, initial encounter: Secondary | ICD-10-CM | POA: Diagnosis present

## 2014-05-02 DIAGNOSIS — S20219A Contusion of unspecified front wall of thorax, initial encounter: Secondary | ICD-10-CM | POA: Diagnosis present

## 2014-05-02 DIAGNOSIS — S3600XA Unspecified injury of spleen, initial encounter: Secondary | ICD-10-CM | POA: Diagnosis present

## 2014-05-02 LAB — CBC
HCT: 33.2 % — ABNORMAL LOW (ref 39.0–52.0)
HCT: 32.1 % — ABNORMAL LOW (ref 39.0–52.0)
HCT: 31.3 % — ABNORMAL LOW (ref 39.0–52.0)
Hemoglobin: 11.5 g/dL — ABNORMAL LOW (ref 13.0–17.0)
Hemoglobin: 11 g/dL — ABNORMAL LOW (ref 13.0–17.0)
Hemoglobin: 11.3 g/dL — ABNORMAL LOW (ref 13.0–17.0)
MCH: 31.5 pg (ref 26.0–34.0)
MCH: 31.3 pg (ref 26.0–34.0)
MCH: 32.6 pg (ref 26.0–34.0)
MCHC: 34.6 g/dL (ref 30.0–36.0)
MCHC: 34.3 g/dL (ref 30.0–36.0)
MCHC: 36.1 g/dL — ABNORMAL HIGH (ref 30.0–36.0)
MCV: 91 fL (ref 78.0–100.0)
MCV: 91.2 fL (ref 78.0–100.0)
MCV: 90.2 fL (ref 78.0–100.0)
Platelets: 143 10*3/uL — ABNORMAL LOW (ref 150–400)
Platelets: 128 10*3/uL — ABNORMAL LOW (ref 150–400)
Platelets: 118 10*3/uL — ABNORMAL LOW (ref 150–400)
RBC: 3.65 MIL/uL — ABNORMAL LOW (ref 4.22–5.81)
RBC: 3.52 MIL/uL — ABNORMAL LOW (ref 4.22–5.81)
RBC: 3.47 MIL/uL — ABNORMAL LOW (ref 4.22–5.81)
RDW: 12.9 % (ref 11.5–15.5)
RDW: 12.9 % (ref 11.5–15.5)
RDW: 12.8 % (ref 11.5–15.5)
WBC: 8.9 10*3/uL (ref 4.0–10.5)
WBC: 7.6 10*3/uL (ref 4.0–10.5)
WBC: 8.7 10*3/uL (ref 4.0–10.5)

## 2014-05-02 LAB — BASIC METABOLIC PANEL
BUN: 17 mg/dL (ref 6–23)
CO2: 25 mEq/L (ref 19–32)
Calcium: 8.1 mg/dL — ABNORMAL LOW (ref 8.4–10.5)
Chloride: 103 mEq/L (ref 96–112)
Creatinine, Ser: 0.68 mg/dL (ref 0.50–1.35)
GFR calc Af Amer: >90 mL/min (ref >90)
GFR calc non Af Amer: >90 mL/min (ref >90)
Glucose, Bld: 129 mg/dL — ABNORMAL HIGH (ref 70–99)
Potassium: 4.1 mEq/L (ref 3.7–5.3)
Sodium: 139 mEq/L (ref 137–147)

## 2014-05-02 LAB — PROTIME-INR
INR: 1.1 (ref 0.00–1.49)
Prothrombin Time: 14 seconds (ref 11.6–15.2)

## 2014-05-02 MED ORDER — ACETAMINOPHEN 325 MG PO TABS: 650.0000 mg | ORAL_TABLET | ORAL | Status: DC | PRN

## 2014-05-02 MED ORDER — SODIUM CHLORIDE 0.9 % IV SOLN
INTRAVENOUS | Status: DC
  Administered 2014-05-02 – 2014-05-03 (×4): via INTRAVENOUS

## 2014-05-02 MED ORDER — OXYCODONE HCL 5 MG PO TABS
5.0000 mg | ORAL_TABLET | ORAL | Status: DC | PRN
  Administered 2014-05-02: 10 mg via ORAL
  Administered 2014-05-03: 15 mg via ORAL
  Administered 2014-05-03 (×2): 10 mg via ORAL
  Filled 2014-05-02: qty 2
  Filled 2014-05-02: qty 3
  Filled 2014-05-02 (×2): qty 2

## 2014-05-02 MED ORDER — MORPHINE SULFATE 2 MG/ML IJ SOLN
2.0000 mg | INTRAMUSCULAR | Status: DC | PRN
Start: 2014-05-02 — End: 2014-05-02
  Administered 2014-05-02 (×4): 2 mg via INTRAVENOUS
  Filled 2014-05-02 (×4): qty 1

## 2014-05-02 MED ORDER — ONDANSETRON HCL 4 MG PO TABS: 4.0000 mg | ORAL_TABLET | Freq: Four times a day (QID) | ORAL | Status: DC | PRN

## 2014-05-02 MED ORDER — ONDANSETRON HCL 4 MG/2ML IJ SOLN: 4.0000 mg | Freq: Four times a day (QID) | INTRAMUSCULAR | Status: DC | PRN

## 2014-05-02 MED ORDER — LISINOPRIL 40 MG PO TABS
40.0000 mg | ORAL_TABLET | Freq: Every day | ORAL | Status: DC
  Administered 2014-05-02 – 2014-05-04 (×3): 40 mg via ORAL
  Filled 2014-05-02 (×2): qty 1
  Filled 2014-05-02: qty 2

## 2014-05-02 MED ORDER — HYDROCHLOROTHIAZIDE 25 MG PO TABS
25.0000 mg | ORAL_TABLET | Freq: Every day | ORAL | Status: DC
  Administered 2014-05-02 – 2014-05-04 (×3): 25 mg via ORAL
  Filled 2014-05-02 (×3): qty 1

## 2014-05-02 MED ORDER — MORPHINE SULFATE 2 MG/ML IJ SOLN
2.0000 mg | INTRAMUSCULAR | Status: DC | PRN
Start: 2014-05-02 — End: 2014-05-04
  Administered 2014-05-02 (×3): 2 mg via INTRAVENOUS
  Filled 2014-05-02 (×3): qty 1

## 2014-05-02 MED ORDER — PANTOPRAZOLE SODIUM 40 MG IV SOLR
40.0000 mg | Freq: Every day | INTRAVENOUS | Status: DC
  Administered 2014-05-02: 40 mg via INTRAVENOUS
  Filled 2014-05-02 (×2): qty 40

## 2014-05-02 MED ORDER — PANTOPRAZOLE SODIUM 40 MG PO TBEC
40.0000 mg | DELAYED_RELEASE_TABLET | Freq: Every day | ORAL | Status: DC
  Administered 2014-05-03 – 2014-05-04 (×2): 40 mg via ORAL
  Filled 2014-05-02 (×2): qty 1

## 2014-05-02 NOTE — ED Notes (Signed)
Hayley, RN informed of pt asking for pain medicine; informed pt per [facility], RN that he will be able to get it at 0945; pt acknowledged and is ok with it

## 2014-05-02 NOTE — ED Notes (Signed)
Pt resting with family at bedside; no needs at this time

## 2014-05-02 NOTE — ED Notes (Signed)
No new bleeding noted to right groin dressing

## 2014-05-02 NOTE — ED Notes (Signed)
Pt stating he is at a 9 on the pain scale; is asking for pain medicine; family at bedside; no other needs at the time

## 2014-05-02 NOTE — ED Notes (Signed)
Attempted report 

## 2014-05-02 NOTE — ED Notes (Signed)
No new bleeding noted to right groin insertion site

## 2014-05-02 NOTE — ED Notes (Signed)
Pt resting, Corrina, PA (Surgery) speaking with pt at this time

## 2014-05-02 NOTE — ED Notes (Signed)
Meal tray ordered for patient.

## 2014-05-02 NOTE — ED Notes (Signed)
Pt resting, no needs at this time.

## 2014-05-02 NOTE — ED Notes (Signed)
Pt handed his phone to place a call

## 2014-05-02 NOTE — Progress Notes (Signed)
UR completed.  Darcee Dekker, RN BSN [facility] CCM Trauma/Neuro ICU Case Manager [phone]  

## 2014-05-02 NOTE — ED Notes (Signed)
No bleeding noted at right groin insertion site
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
## 2014-05-02 NOTE — ED Notes (Signed)
Pt made aware his pain medication was due at 1750. Pt thanked this Therapist, sports and states he was ready whenever it is due.

## 2014-05-02 NOTE — ED Notes (Signed)
Dr.Wyatt at the bedside 

## 2014-05-02 NOTE — Progress Notes (Signed)
Hemoglobin has dropped a bit, hemodynamically stable.  I believe that he could go to SDU.  Clear liquids are okay.  This patient has been seen and I agree with the findings and treatment plan.  Kathryne Eriksson. Dahlia Bailiff, MD, [place] [phone] (pager) [phone] (direct pager) Trauma Surgeon
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
## 2014-05-02 NOTE — ED Notes (Signed)
Pt has finished his meal tray; wife at bedside; clean urinal handed to pt; pt resting

## 2014-05-02 NOTE — ED Notes (Signed)
Ordered clear liquid diet
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
## 2014-05-02 NOTE — ED Notes (Signed)
Family at bedside. 

## 2014-05-03 ENCOUNTER — Encounter: Payer: Self-pay | Admitting: Family Medicine

## 2014-05-03 LAB — MRSA PCR SCREENING: MRSA by PCR: NEGATIVE
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
## 2014-05-04 LAB — CBC
HCT: 30.6 % — ABNORMAL LOW (ref 39.0–52.0)
Hemoglobin: 10.6 g/dL — ABNORMAL LOW (ref 13.0–17.0)
MCH: 31.2 pg (ref 26.0–34.0)
MCHC: 34.6 g/dL (ref 30.0–36.0)
MCV: 90 fL (ref 78.0–100.0)
Platelets: 116 10*3/uL — ABNORMAL LOW (ref 150–400)
RBC: 3.4 MIL/uL — ABNORMAL LOW (ref 4.22–5.81)
RDW: 12.5 % (ref 11.5–15.5)
WBC: 7.5 10*3/uL (ref 4.0–10.5)

## 2014-05-04 MED ORDER — OXYCODONE-ACETAMINOPHEN 7.5-325 MG PO TABS: 1.0000 | ORAL_TABLET | ORAL | Status: DC | PRN

## 2014-05-04 NOTE — Progress Notes (Signed)
Discharge instructions explained with teach back. Prescription for Percocet given. Discharged via wheelchair in good condition accompanied by father. Melford Aase, RN 05/04/2014 1126
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
## 2014-05-04 NOTE — Discharge Instructions (Signed)
See above

## 2014-05-07 ENCOUNTER — Telehealth (HOSPITAL_COMMUNITY): Payer: Self-pay

## 2014-05-07 NOTE — Telephone Encounter (Signed)
Scheduled appt for 6/3

## 2014-05-15 ENCOUNTER — Ambulatory Visit (INDEPENDENT_AMBULATORY_CARE_PROVIDER_SITE_OTHER): Payer: BC Managed Care – PPO | Admitting: Orthopedic Surgery

## 2014-05-15 ENCOUNTER — Encounter (INDEPENDENT_AMBULATORY_CARE_PROVIDER_SITE_OTHER): Payer: Self-pay | Admitting: *Deleted

## 2014-05-15 ENCOUNTER — Encounter (INDEPENDENT_AMBULATORY_CARE_PROVIDER_SITE_OTHER): Payer: Self-pay

## 2014-05-15 VITALS — BP 120/90 | HR 72 | Temp 98.6°F | Resp 14 | Ht 72.0 in | Wt 224.2 lb

## 2014-05-15 DIAGNOSIS — S36039A Unspecified laceration of spleen, initial encounter: Secondary | ICD-10-CM

## 2014-05-15 DIAGNOSIS — S3609XA Other injury of spleen, initial encounter: Secondary | ICD-10-CM
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
## 2014-05-29 ENCOUNTER — Ambulatory Visit (INDEPENDENT_AMBULATORY_CARE_PROVIDER_SITE_OTHER): Payer: BC Managed Care – PPO

## 2014-05-29 ENCOUNTER — Ambulatory Visit (INDEPENDENT_AMBULATORY_CARE_PROVIDER_SITE_OTHER): Payer: BC Managed Care – PPO | Admitting: Family Medicine

## 2014-05-29 VITALS — BP 126/80 | HR 72 | Temp 98.3°F | Resp 16 | Ht 71.0 in | Wt 226.0 lb

## 2014-05-29 DIAGNOSIS — M79609 Pain in unspecified limb: Secondary | ICD-10-CM

## 2014-05-29 DIAGNOSIS — M79674 Pain in right toe(s): Secondary | ICD-10-CM

## 2014-05-29 DIAGNOSIS — Z8781 Personal history of (healed) traumatic fracture: Secondary | ICD-10-CM

## 2014-05-29 DIAGNOSIS — M79604 Pain in right leg: Secondary | ICD-10-CM

## 2014-05-29 NOTE — Patient Instructions (Signed)
Tylenol as needed for pain in leg until seen by orthopaedic doctor. Return to the clinic or go to the nearest emergency room if any of your symptoms worsen or new symptoms occur. You should receive a call or letter about your lab results within the next week to 10 days.  We will call you to schedule follow up for blood pressure and cholesterol.
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
## 2014-05-30 LAB — URIC ACID: Uric Acid, Serum: 5.9 mg/dL (ref 4.0–7.8)

## 2014-06-05 NOTE — Progress Notes (Signed)
Left a message for patient to return call to schedule appointment.

## 2014-06-11 NOTE — Progress Notes (Signed)
Left a message for patient to return call for appointment with Dr. Carlota Raspberry

## 2014-06-12 ENCOUNTER — Encounter: Payer: Self-pay | Admitting: Family Medicine

## 2014-06-12 ENCOUNTER — Other Ambulatory Visit: Payer: Self-pay | Admitting: Family Medicine

## 2014-06-12 NOTE — Progress Notes (Signed)
Left a message for patient to return call and also sent an unable to reach letter.

## 2014-07-11 ENCOUNTER — Ambulatory Visit (INDEPENDENT_AMBULATORY_CARE_PROVIDER_SITE_OTHER): Payer: BC Managed Care – PPO | Admitting: Family Medicine

## 2014-07-11 VITALS — BP 130/88 | HR 77 | Temp 98.9°F | Resp 18 | Ht 71.0 in | Wt 230.0 lb

## 2014-07-11 DIAGNOSIS — I10 Essential (primary) hypertension: Secondary | ICD-10-CM

## 2014-07-11 DIAGNOSIS — E781 Pure hyperglyceridemia: Secondary | ICD-10-CM

## 2014-07-11 DIAGNOSIS — R739 Hyperglycemia, unspecified: Secondary | ICD-10-CM

## 2014-07-11 DIAGNOSIS — R7309 Other abnormal glucose: Secondary | ICD-10-CM

## 2014-07-11 LAB — COMPLETE METABOLIC PANEL WITH GFR
ALT: 15 U/L (ref 0–53)
AST: 17 U/L (ref 0–37)
Albumin: 4.7 g/dL (ref 3.5–5.2)
Alkaline Phosphatase: 39 U/L (ref 39–117)
BUN: 14 mg/dL (ref 6–23)
CO2: 27 mEq/L (ref 19–32)
Calcium: 9.4 mg/dL (ref 8.4–10.5)
Chloride: 103 mEq/L (ref 96–112)
Creat: 0.73 mg/dL (ref 0.50–1.35)
GFR, Est African American: >89 mL/min
GFR, Est Non African American: >89 mL/min
Glucose, Bld: 92 mg/dL (ref 70–99)
Potassium: 4.5 mEq/L (ref 3.5–5.3)
Sodium: 140 mEq/L (ref 135–145)
Total Bilirubin: 0.5 mg/dL (ref 0.2–1.2)
Total Protein: 7 g/dL (ref 6.0–8.3)

## 2014-07-11 LAB — GLUCOSE, POCT (MANUAL RESULT ENTRY): POC Glucose: 92 mg/dl (ref 70–99)

## 2014-07-11 LAB — LIPID PANEL
Cholesterol: 200 mg/dL (ref 0–200)
HDL: 76 mg/dL (ref >39)
LDL Cholesterol: 107 mg/dL — ABNORMAL HIGH (ref 0–99)
Total CHOL/HDL Ratio: 2.6 Ratio
Triglycerides: 84 mg/dL (ref <150)
VLDL: 17 mg/dL (ref 0–40)

## 2014-07-11 MED ORDER — LISINOPRIL-HYDROCHLOROTHIAZIDE 20-12.5 MG PO TABS: 2.0000 | ORAL_TABLET | Freq: Every day | ORAL | Status: DC

## 2014-07-11 NOTE — Patient Instructions (Signed)
Watch diet, no changes in meds for now.  You should receive a call or letter about your lab results within the next week to 10 days.  We will call you for physical in 6 months.
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
## 2014-07-11 NOTE — Progress Notes (Signed)
CPE appt w Dr. Carlota Raspberry scheduled for 12/23/14 at 1pm

## 2014-12-23 ENCOUNTER — Encounter: Payer: Self-pay | Admitting: Family Medicine

## 2014-12-23 ENCOUNTER — Ambulatory Visit (INDEPENDENT_AMBULATORY_CARE_PROVIDER_SITE_OTHER): Payer: BLUE CROSS/BLUE SHIELD | Admitting: Family Medicine

## 2014-12-23 VITALS — BP 142/90 | HR 72 | Temp 98.3°F | Resp 16 | Ht 71.5 in | Wt 234.0 lb

## 2014-12-23 DIAGNOSIS — Z125 Encounter for screening for malignant neoplasm of prostate: Secondary | ICD-10-CM

## 2014-12-23 DIAGNOSIS — I10 Essential (primary) hypertension: Secondary | ICD-10-CM

## 2014-12-23 DIAGNOSIS — Z130 Encounter for screening for diseases of the blood and blood-forming organs and certain disorders involving the immune mechanism: Secondary | ICD-10-CM

## 2014-12-23 DIAGNOSIS — R319 Hematuria, unspecified: Secondary | ICD-10-CM

## 2014-12-23 DIAGNOSIS — Z113 Encounter for screening for infections with a predominantly sexual mode of transmission: Secondary | ICD-10-CM

## 2014-12-23 DIAGNOSIS — Z0000 Encounter for general adult medical examination without abnormal findings: Secondary | ICD-10-CM

## 2014-12-23 LAB — CBC
HCT: 47.7 % (ref 39.0–52.0)
Hemoglobin: 16.2 g/dL (ref 13.0–17.0)
MCH: 31.1 pg (ref 26.0–34.0)
MCHC: 34 g/dL (ref 30.0–36.0)
MCV: 91.6 fL (ref 78.0–100.0)
MPV: 9.3 fL (ref 8.6–12.4)
Platelets: 240 10*3/uL (ref 150–400)
RBC: 5.21 MIL/uL (ref 4.22–5.81)
RDW: 12.9 % (ref 11.5–15.5)
WBC: 6.3 10*3/uL (ref 4.0–10.5)

## 2014-12-23 LAB — POCT URINALYSIS DIPSTICK
Bilirubin, UA: NEGATIVE
Blood, UA: NEGATIVE
Glucose, UA: NEGATIVE
Ketones, UA: NEGATIVE
Leukocytes, UA: NEGATIVE
Nitrite, UA: NEGATIVE
Protein, UA: NEGATIVE
Spec Grav, UA: 1.015
Urobilinogen, UA: 1
pH, UA: 7.5

## 2014-12-23 LAB — POCT UA - MICROSCOPIC ONLY
Casts, Ur, LPF, POC: NEGATIVE
Crystals, Ur, HPF, POC: NEGATIVE
Mucus, UA: POSITIVE
Yeast, UA: NEGATIVE

## 2014-12-23 MED ORDER — LISINOPRIL-HYDROCHLOROTHIAZIDE 20-12.5 MG PO TABS: 2.0000 | ORAL_TABLET | Freq: Every day | ORAL | Status: DC

## 2014-12-23 NOTE — Patient Instructions (Signed)

## 2014-12-23 NOTE — Progress Notes (Signed)
MRN: [id number] DOB: Sep 19, 1961  Subjective:   Jonathon Fischer is a 54 y.o. male presenting for a physical exam.  Patient has no new concerns or complaints at this time.  He is also requesting refill on his medication for hypertension.  He states that his BP is normally much lower, but he has had a lot of caffeine and rushed.  He denies any chest pains, palpitations, or leg swellings.    Diet and exercise: Attempts to eat vegetables and fish.  He has not been exercising, but plans to restart when he has appropriate footwear.  He works as a Patent attorney, and must walk around in [employer] and walks and down a 5 steps about 100x/day.  He states that he has bilateral knee pain.  This was followed by [facility] orthopedics.  He was given a cortisone injection, but is wearing off, though much less painful than prior complaints.  He denies no numbness, tingling, swelling, or redness.     Jonathon Fischer has a current medication list which includes the following prescription(s): lisinopril-hydrochlorothiazide.  He has No Known Allergies.  Jonathon Fischer  has a past medical history of Hypertension. Also  has past surgical history that includes Femur fracture surgery.  Review of Systems  Constitutional: Negative for fever.  HENT: Positive for tinnitus. Negative for ear pain.   Eyes: Negative for blurred vision, double vision and pain.  Respiratory: Negative for cough, shortness of breath and wheezing.   Cardiovascular: Negative for chest pain, palpitations and leg swelling.  Gastrointestinal: Negative for nausea, vomiting, diarrhea, constipation and blood in stool.  Genitourinary: Negative for dysuria and frequency.  Musculoskeletal: Positive for joint pain (hx of knee pain).  Neurological: Negative for dizziness, tingling, tremors, sensory change and headaches.   Objective:  Vital Signs BP 142/90 mmHg  Pulse 72  Temp(Src) 98.3 F (36.8 C) (Oral)  Resp 16  Ht 5' 11.5" (1.816 m)  Wt 234 lb (106.142  kg)  BMI 32.19 kg/m2  SpO2 98%  Physical Exam  Constitutional: He is oriented to person, place, and time and well-developed, well-nourished, and in no distress. No distress.  HENT:  Head: Normocephalic and atraumatic.  Right Ear: External ear and ear canal normal. Tympanic membrane is perforated (Smooth and rounded perforation at antero-inferior base.  No erythema or injection at TM.  ).  Left Ear: Tympanic membrane, external ear and ear canal normal.  Nose: No mucosal edema or rhinorrhea.  Mouth/Throat: No uvula swelling. No oropharyngeal exudate, posterior oropharyngeal edema or posterior oropharyngeal erythema.  Eyes: Conjunctivae and EOM are normal. Pupils are equal, round, and reactive to light.  Fundoscopic exam detected no abnormalities.    Neck: Trachea normal and normal range of motion. No tracheal tenderness present. Carotid bruit is not present. No erythema and normal range of motion present. No thyromegaly present.  Cardiovascular: Normal rate, regular rhythm and intact distal pulses.  Exam reveals no gallop, no distant heart sounds and no friction rub.   No murmur heard. Pulses:      Dorsalis pedis pulses are 2+ on the right side, and 2+ on the left side.  Pulmonary/Chest: Breath sounds normal. No accessory muscle usage. No apnea. No respiratory distress. He has no decreased breath sounds. He has no wheezes. He has no rhonchi.  Abdominal: Soft. Normal appearance and bowel sounds are normal. He exhibits no distension and no mass. There is no hepatosplenomegaly. There is tenderness in the epigastric area and left upper quadrant.  Genitourinary: Rectum normal,  prostate normal, testes/scrotum normal and penis normal. No discharge found.  Musculoskeletal: Normal range of motion.  Full ROM of upper and lower extremities.  Lymphadenopathy:       Head (right side): No submental, no submandibular, no tonsillar, no preauricular, no posterior auricular and no occipital adenopathy present.         Head (left side): No submental, no submandibular, no tonsillar, no preauricular, no posterior auricular and no occipital adenopathy present.    He has no cervical adenopathy.    He has no axillary adenopathy.       Right: No supraclavicular adenopathy present.       Left: No supraclavicular adenopathy present.  Neurological: He is alert and oriented to person, place, and time. He has intact cranial nerves.  Skin: Skin is warm, dry and intact. No rash noted. He is not diaphoretic.  Psychiatric: Mood, memory, affect and judgment normal.   Results for orders placed or performed in visit on 12/23/14  POCT UA - Microscopic Only  Result Value Ref Range   WBC, Ur, HPF, POC 2-4    RBC, urine, microscopic 4-7    Bacteria, U Microscopic 2+    Mucus, UA pos    Epithelial cells, urine per micros 0-1    Crystals, Ur, HPF, POC neg    Casts, Ur, LPF, POC neg    Yeast, UA neg   POCT urinalysis dipstick  Result Value Ref Range   Color, UA yellow    Clarity, UA clear    Glucose, UA neg    Bilirubin, UA neg    Ketones, UA neg    Spec Grav, UA 1.015    Blood, UA neg    pH, UA 7.5    Protein, UA neg    Urobilinogen, UA 1.0    Nitrite, UA neg    Leukocytes, UA Negative     Assessment and Plan :  54 year old male with PMH of HTN, is here today for complete physical exam.    Annual physical exam - Plan: CBC, PSA, RPR, GC/Chlamydia Probe Amp, HIV antibody, POCT UA - Microscopic Only, POCT urinalysis dipstick, lisinopril-hydrochlorothiazide (PRINZIDE,ZESTORETIC) 20-12.5 MG per tablet Essential hypertension - Plan: POCT UA - Microscopic Only, POCT urinalysis dipstick  Essential hypertension, benign - Plan: lisinopril-hydrochlorothiazide (PRINZIDE,ZESTORETIC) 20-12.5 MG per tablet  Hematuria Positve UA, but asymptomatic--no abx at this time.  Recheck in 6-8 wks.  If he does become asymptomatic will treat in the meantime.  If hematuria is still present, culture and prescribe antibiotic.   Hematuria continues, refer to urology.    Screening for deficiency anemia - Plan: CBC  Screening for prostate cancer - Plan: PSA  Screening for STD (sexually transmitted disease) - Plan: RPR, GC/Chlamydia Probe Amp, HIV antibody

## 2014-12-24 LAB — GC/CHLAMYDIA PROBE AMP
CT Probe RNA: NEGATIVE
GC Probe RNA: NEGATIVE

## 2014-12-24 LAB — HIV ANTIBODY (ROUTINE TESTING W REFLEX): HIV 1&2 Ab, 4th Generation: NONREACTIVE

## 2014-12-24 LAB — RPR

## 2014-12-24 LAB — PSA: PSA: 1.83 ng/mL (ref <=4.00)

## 2014-12-25 NOTE — Progress Notes (Signed)
Patient discussed/examined with Ms. English. Agree with assessment and plan of care per her note.  Discussed new shoe fitting and options, and if needed - can RTC for further eval of foot mechanics once he starts exercising.   If knee pain persists/worsens - rtc for recehck. If hematuria symptomatic - recheck with possible abx/cx as below.

## 2015-07-04 ENCOUNTER — Other Ambulatory Visit: Payer: Self-pay

## 2015-07-04 DIAGNOSIS — I10 Essential (primary) hypertension: Secondary | ICD-10-CM

## 2015-07-04 DIAGNOSIS — Z0000 Encounter for general adult medical examination without abnormal findings: Secondary | ICD-10-CM

## 2015-07-04 MED ORDER — LISINOPRIL-HYDROCHLOROTHIAZIDE 20-12.5 MG PO TABS: 2.0000 | ORAL_TABLET | Freq: Every day | ORAL | Status: DC

## 2015-08-11 ENCOUNTER — Ambulatory Visit (INDEPENDENT_AMBULATORY_CARE_PROVIDER_SITE_OTHER): Payer: BLUE CROSS/BLUE SHIELD | Admitting: Internal Medicine

## 2015-08-11 VITALS — BP 112/76 | HR 68 | Temp 98.6°F | Resp 18 | Ht 71.5 in | Wt 243.0 lb

## 2015-08-11 DIAGNOSIS — I10 Essential (primary) hypertension: Secondary | ICD-10-CM

## 2015-08-11 DIAGNOSIS — Z0000 Encounter for general adult medical examination without abnormal findings: Secondary | ICD-10-CM | POA: Diagnosis not present

## 2015-08-11 DIAGNOSIS — Z7189 Other specified counseling: Secondary | ICD-10-CM | POA: Diagnosis not present

## 2015-08-11 LAB — BASIC METABOLIC PANEL
BUN: 15 mg/dL (ref 7–25)
CO2: 30 mmol/L (ref 20–31)
Calcium: 9.7 mg/dL (ref 8.6–10.3)
Chloride: 101 mmol/L (ref 98–110)
Creat: 0.93 mg/dL (ref 0.70–1.33)
Glucose, Bld: 92 mg/dL (ref 65–99)
Potassium: 4.7 mmol/L (ref 3.5–5.3)
Sodium: 139 mmol/L (ref 135–146)

## 2015-08-11 MED ORDER — LISINOPRIL-HYDROCHLOROTHIAZIDE 20-12.5 MG PO TABS: 2.0000 | ORAL_TABLET | Freq: Every day | ORAL | Status: DC

## 2015-08-11 NOTE — Patient Instructions (Addendum)
DASH Eating Plan DASH stands for "Dietary Approaches to Stop Hypertension." The DASH eating plan is a healthy eating plan that has been shown to reduce high blood pressure (hypertension). Additional health benefits may include reducing the risk of type 2 diabetes mellitus, heart disease, and stroke. The DASH eating plan may also help with weight loss. WHAT DO I NEED TO KNOW ABOUT THE DASH EATING PLAN? For the DASH eating plan, you will follow these general guidelines:  Choose foods with a percent daily value for sodium of less than 5% (as listed on the food label).  Use salt-free seasonings or herbs instead of table salt or sea salt.  Check with your health care provider or pharmacist before using salt substitutes.  Eat lower-sodium products, often labeled as "lower sodium" or "no salt added."  Eat fresh foods.  Eat more vegetables, fruits, and low-fat dairy products.  Choose whole grains. Look for the word "whole" as the first word in the ingredient list.  Choose fish and skinless chicken or turkey more often than red meat. Limit fish, poultry, and meat to 6 oz (170 g) each day.  Limit sweets, desserts, sugars, and sugary drinks.  Choose heart-healthy fats.  Limit cheese to 1 oz (28 g) per day.  Eat more home-cooked food and less restaurant, buffet, and fast food.  Limit fried foods.  Cook foods using methods other than frying.  Limit canned vegetables. If you do use them, rinse them well to decrease the sodium.  When eating at a restaurant, ask that your food be prepared with less salt, or no salt if possible. WHAT FOODS CAN I EAT? Seek help from a dietitian for individual calorie needs. Grains Whole grain or whole wheat bread. Brown rice. Whole grain or whole wheat pasta. Quinoa, bulgur, and whole grain cereals. Low-sodium cereals. Corn or whole wheat flour tortillas. Whole grain cornbread. Whole grain crackers. Low-sodium crackers. Vegetables Fresh or frozen vegetables  (raw, steamed, roasted, or grilled). Low-sodium or reduced-sodium tomato and vegetable juices. Low-sodium or reduced-sodium tomato sauce and paste. Low-sodium or reduced-sodium canned vegetables.  Fruits All fresh, canned (in natural juice), or frozen fruits. Meat and Other Protein Products Ground beef (85% or leaner), grass-fed beef, or beef trimmed of fat. Skinless chicken or turkey. Ground chicken or turkey. Pork trimmed of fat. All fish and seafood. Eggs. Dried beans, peas, or lentils. Unsalted nuts and seeds. Unsalted canned beans. Dairy Low-fat dairy products, such as skim or 1% milk, 2% or reduced-fat cheeses, low-fat ricotta or cottage cheese, or plain low-fat yogurt. Low-sodium or reduced-sodium cheeses. Fats and Oils Tub margarines without trans fats. Light or reduced-fat mayonnaise and salad dressings (reduced sodium). Avocado. Safflower, olive, or canola oils. Natural peanut or almond butter. Other Unsalted popcorn and pretzels. The items listed above may not be a complete list of recommended foods or beverages. Contact your dietitian for more options. WHAT FOODS ARE NOT RECOMMENDED? Grains White bread. White pasta. White rice. Refined cornbread. Bagels and croissants. Crackers that contain trans fat. Vegetables Creamed or fried vegetables. Vegetables in a cheese sauce. Regular canned vegetables. Regular canned tomato sauce and paste. Regular tomato and vegetable juices. Fruits Dried fruits. Canned fruit in light or heavy syrup. Fruit juice. Meat and Other Protein Products Fatty cuts of meat. Ribs, chicken wings, bacon, sausage, bologna, salami, chitterlings, fatback, hot dogs, bratwurst, and packaged luncheon meats. Salted nuts and seeds. Canned beans with salt. Dairy Whole or 2% milk, cream, half-and-half, and cream cheese. Whole-fat or sweetened yogurt. Full-fat   cheeses or blue cheese. Nondairy creamers and whipped toppings. Processed cheese, cheese spreads, or cheese  curds. Condiments Onion and garlic salt, seasoned salt, table salt, and sea salt. Canned and packaged gravies. Worcestershire sauce. Tartar sauce. Barbecue sauce. Teriyaki sauce. Soy sauce, including reduced sodium. Steak sauce. Fish sauce. Oyster sauce. Cocktail sauce. Horseradish. Ketchup and mustard. Meat flavorings and tenderizers. Bouillon cubes. Hot sauce. Tabasco sauce. Marinades. Taco seasonings. Relishes. Fats and Oils Butter, stick margarine, lard, shortening, ghee, and bacon fat. Coconut, palm kernel, or palm oils. Regular salad dressings. Other Pickles and olives. Salted popcorn and pretzels. The items listed above may not be a complete list of foods and beverages to avoid. Contact your dietitian for more information. WHERE CAN I FIND MORE INFORMATION? National Heart, Lung, and Blood Institute: travelstabloid.com Document Released: 11/18/2011 Document Revised: 04/15/2014 Document Reviewed: 10/03/2013 [facility] Patient Information 2015 [place], [place]. This information is not intended to replace advice given to you by your health care provider. Make sure you discuss any questions you have with your health care provider. Calorie Counting for Weight Loss Calories are energy you get from the things you eat and drink. Your body uses this energy to keep you going throughout the day. The number of calories you eat affects your weight. When you eat more calories than your body needs, your body stores the extra calories as fat. When you eat fewer calories than your body needs, your body burns fat to get the energy it needs. Calorie counting means keeping track of how many calories you eat and drink each day. If you make sure to eat fewer calories than your body needs, you should lose weight. In order for calorie counting to work, you will need to eat the number of calories that are right for you in a day to lose a healthy amount of weight per week. A healthy amount of  weight to lose per week is usually 1-2 lb (0.5-0.9 kg). A dietitian can determine how many calories you need in a day and give you suggestions on how to reach your calorie goal.  WHAT IS MY MY PLAN? My goal is to have __________ calories per day.  If I have this many calories per day, I should lose around __________ pounds per week. WHAT DO I NEED TO KNOW ABOUT CALORIE COUNTING? In order to meet your daily calorie goal, you will need to:  Find out how many calories are in each food you would like to eat. Try to do this before you eat.  Decide how much of the food you can eat.  Write down what you ate and how many calories it had. Doing this is called keeping a food log. WHERE DO I FIND CALORIE INFORMATION? The number of calories in a food can be found on a Nutrition Facts label. Note that all the information on a label is based on a specific serving of the food. If a food does not have a Nutrition Facts label, try to look up the calories online or ask your dietitian for help. HOW DO I DECIDE HOW MUCH TO EAT? To decide how much of the food you can eat, you will need to consider both the number of calories in one serving and the size of one serving. This information can be found on the Nutrition Facts label. If a food does not have a Nutrition Facts label, look up the information online or ask your dietitian for help. Remember that calories are listed per serving. If you  choose to have more than one serving of a food, you will have to multiply the calories per serving by the amount of servings you plan to eat. For example, the label on a package of bread might say that a serving size is 1 slice and that there are 90 calories in a serving. If you eat 1 slice, you will have eaten 90 calories. If you eat 2 slices, you will have eaten 180 calories. HOW DO I KEEP A FOOD LOG? After each meal, record the following information in your food log:  What you ate.  How much of it you ate.  How many calories  it had.  Then, add up your calories. Keep your food log near you, such as in a small notebook in your pocket. Another option is to use a mobile app or website. Some programs will calculate calories for you and show you how many calories you have left each time you add an item to the log. WHAT ARE SOME CALORIE COUNTING TIPS?  Use your calories on foods and drinks that will fill you up and not leave you hungry. Some examples of this include foods like nuts and nut butters, vegetables, lean proteins, and high-fiber foods (more than 5 g fiber per serving).  Eat nutritious foods and avoid empty calories. Empty calories are calories you get from foods or beverages that do not have many nutrients, such as candy and soda. It is better to have a nutritious high-calorie food (such as an avocado) than a food with few nutrients (such as a bag of chips).  Know how many calories are in the foods you eat most often. This way, you do not have to look up how many calories they have each time you eat them.  Look out for foods that may seem like low-calorie foods but are really high-calorie foods, such as baked goods, soda, and fat-free candy.  Pay attention to calories in drinks. Drinks such as sodas, specialty coffee drinks, alcohol, and juices have a lot of calories yet do not fill you up. Choose low-calorie drinks like water and diet drinks.  Focus your calorie counting efforts on higher calorie items. Logging the calories in a garden salad that contains only vegetables is less important than calculating the calories in a milk shake.  Find a way of tracking calories that works for you. Get creative. Most people who are successful find ways to keep track of how much they eat in a day, even if they do not count every calorie. WHAT ARE SOME PORTION CONTROL TIPS?  Know how many calories are in a serving. This will help you know how many servings of a certain food you can have.  Use a measuring cup to measure  serving sizes. This is helpful when you start out. With time, you will be able to estimate serving sizes for some foods.  Take some time to put servings of different foods on your favorite plates, bowls, and cups so you know what a serving looks like.  Try not to eat straight from a bag or box. Doing this can lead to overeating. Put the amount you would like to eat in a cup or on a plate to make sure you are eating the right portion.  Use smaller plates, glasses, and bowls to prevent overeating. This is a quick and easy way to practice portion control. If your plate is smaller, less food can fit on it.  Try not to multitask while eating, such  as watching TV or using your computer. If it is time to eat, sit down at a table and enjoy your food. Doing this will help you to start recognizing when you are full. It will also make you more aware of what and how much you are eating. HOW CAN I CALORIE COUNT WHEN EATING OUT?  Ask for smaller portion sizes or child-sized portions.  Consider sharing an entree and sides instead of getting your own entree.  If you get your own entree, eat only half. Ask for a box at the beginning of your meal and put the rest of your entree in it so you are not tempted to eat it.  Look for the calories on the menu. If calories are listed, choose the lower calorie options.  Choose dishes that include vegetables, fruits, whole grains, low-fat dairy products, and lean protein. Focusing on smart food choices from each of the 5 food groups can help you stay on track at restaurants.  Choose items that are boiled, broiled, grilled, or steamed.  Choose water, milk, unsweetened iced tea, or other drinks without added sugars. If you want an alcoholic beverage, choose a lower calorie option. For example, a regular margarita can have up to 700 calories and a glass of wine has around 150.  Stay away from items that are buttered, battered, fried, or served with cream sauce. Items  labeled "crispy" are usually fried, unless stated otherwise.  Ask for dressings, sauces, and syrups on the side. These are usually very high in calories, so do not eat much of them.  Watch out for salads. Many people think salads are a healthy option, but this is often not the case. Many salads come with bacon, fried chicken, lots of cheese, fried chips, and dressing. All of these items have a lot of calories. If you want a salad, choose a garden salad and ask for grilled meats or steak. Ask for the dressing on the side, or ask for olive oil and vinegar or lemon to use as dressing.  Estimate how many servings of a food you are given. For example, a serving of cooked rice is  cup or about the size of half a tennis ball or one cupcake wrapper. Knowing serving sizes will help you be aware of how much food you are eating at restaurants. The list below tells you how big or small some common portion sizes are based on everyday objects.  1 oz--4 stacked dice.  3 oz--1 deck of cards.  1 tsp--1 dice.  1 Tbsp-- a Ping-Pong ball.  2 Tbsp--1 Ping-Pong ball.   cup--1 tennis ball or 1 cupcake wrapper.  1 cup--1 baseball. Document Released: 11/29/2005 Document Revised: 04/15/2014 Document Reviewed: 10/04/2013 [facility] Patient Information 2015 [place], [place]. This information is not intended to replace advice given to you by your health care provider. Make sure you discuss any questions you have with your health care provider.

## 2015-08-11 NOTE — Progress Notes (Signed)
Patient ID: Jonathon Fischer, male   DOB: [birth year], 54 y.o.   MRN: [id number]   08/11/2015 at 10:48 AM  Jonathon Fischer / DOB: 1961/03/20 / MRN: [id number]  Problem list reviewed and updated by me where necessary.   SUBJECTIVE  Jonathon Fischer is a 54 y.o. well appearing male presenting for the chief complaint of needing refill of BP meds. 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 Last CPE 1/16    He  has a past medical history of Hypertension.    Medications reviewed and updated by myself where necessary, and exist elsewhere in the encounter.   Jonathon Fischer has No Known Allergies. He  reports that he quit smoking about 12 years ago. He does not have any smokeless tobacco history on file. He reports that he drinks about 6.0 oz of alcohol per week. He reports that he does not use illicit drugs. He  has no sexual activity history on file. The patient  has past surgical history that includes Femur fracture surgery.  His family history includes Cancer in his maternal grandfather.  Review of Systems  Constitutional: Negative for fever.  Respiratory: Negative for shortness of breath.   Cardiovascular: Negative for chest pain.  Gastrointestinal: Negative for nausea.  Skin: Negative for rash.  Neurological: Negative for dizziness and headaches.    OBJECTIVE  His  height is 5' 11.5" (1.816 m) and weight is 243 lb (110.224 kg). His oral temperature is 98.6 F (37 C). His blood pressure is 112/76 and his pulse is 68. His respiration is 18 and oxygen saturation is 98%.  The patient's body mass index is 33.42 kg/(m^2).  Physical Exam  Constitutional: He is oriented to person, place, and time. He appears well-developed and well-nourished. No distress.  HENT:  Head: Normocephalic.  Nose: Nose normal.  Eyes: Conjunctivae and EOM are normal.  Cardiovascular: Normal rate, regular rhythm and normal heart sounds.   Respiratory: Effort normal and breath sounds normal. He exhibits  no tenderness.  Neurological: He is alert and oriented to person, place, and time. He exhibits normal muscle tone. Coordination normal.  Skin: No rash noted.  Psychiatric: He has a normal mood and affect.    No results found for this or any previous visit (from the past 24 hour(s)).  ASSESSMENT & PLAN  Jonathon Fischer was seen today for follow-up, hypertension and medication refill.  Diagnoses and all orders for this visit:  Essential hypertension -     Basic metabolic panel -     lisinopril-hydrochlorothiazide (PRINZIDE,ZESTORETIC) 20-12.5 MG per tablet; Take 2 tablets by mouth daily. PATIENT NEEDS OFFICE VISIT FOR ADDITIONAL REFILLS  Encounter for medication review and counseling -     Basic metabolic panel -     lisinopril-hydrochlorothiazide (PRINZIDE,ZESTORETIC) 20-12.5 MG per tablet; Take 2 tablets by mouth daily. PATIENT NEEDS OFFICE VISIT FOR ADDITIONAL REFILLS  Essential hypertension, benign  Annual physical exam  Needs colonoscopy soon

## 2015-08-12 ENCOUNTER — Encounter: Payer: Self-pay | Admitting: Family Medicine

## 2016-02-10 ENCOUNTER — Other Ambulatory Visit: Payer: Self-pay | Admitting: Internal Medicine

## 2016-05-13 ENCOUNTER — Ambulatory Visit (INDEPENDENT_AMBULATORY_CARE_PROVIDER_SITE_OTHER): Payer: Self-pay | Admitting: Family Medicine

## 2016-05-13 VITALS — BP 120/76 | HR 68 | Temp 98.0°F | Resp 18 | Ht 71.5 in | Wt 250.2 lb

## 2016-05-13 DIAGNOSIS — H6990 Unspecified Eustachian tube disorder, unspecified ear: Secondary | ICD-10-CM

## 2016-05-13 DIAGNOSIS — I10 Essential (primary) hypertension: Secondary | ICD-10-CM

## 2016-05-13 DIAGNOSIS — Z6834 Body mass index (BMI) 34.0-34.9, adult: Secondary | ICD-10-CM

## 2016-05-13 DIAGNOSIS — H6121 Impacted cerumen, right ear: Secondary | ICD-10-CM

## 2016-05-13 LAB — BASIC METABOLIC PANEL WITH GFR
BUN: 13 mg/dL (ref 7–25)
CO2: 24 mmol/L (ref 20–31)
Calcium: 8.8 mg/dL (ref 8.6–10.3)
Chloride: 102 mmol/L (ref 98–110)
Creat: 0.79 mg/dL (ref 0.70–1.33)
Glucose, Bld: 77 mg/dL (ref 65–99)
Potassium: 4.5 mmol/L (ref 3.5–5.3)
Sodium: 137 mmol/L (ref 135–146)

## 2016-05-13 LAB — LIPID PANEL
Cholesterol: 167 mg/dL (ref 125–200)
HDL: 61 mg/dL (ref >=40)
LDL Cholesterol: 92 mg/dL (ref <130)
Total CHOL/HDL Ratio: 2.7 Ratio (ref <=5.0)
Triglycerides: 68 mg/dL (ref <150)
VLDL: 14 mg/dL (ref <30)

## 2016-05-13 MED ORDER — LISINOPRIL-HYDROCHLOROTHIAZIDE 20-25 MG PO TABS
1.0000 | ORAL_TABLET | Freq: Every day | ORAL | Status: DC
Start: 2016-05-13 — End: 2017-05-14

## 2016-05-13 MED ORDER — FLUTICASONE PROPIONATE 50 MCG/ACT NA SUSP: 2.0000 | Freq: Every day | NASAL | Status: DC

## 2016-05-13 NOTE — Patient Instructions (Addendum)
For chronic eustachian tube disorder, you can use Flonase nasal spray. It works best if you use it daily. I have provided a written prescription so you can compare the price versus buying over the counter.   Barotitis Media Barotitis media is inflammation of your middle ear. This occurs when the auditory tube (eustachian tube) leading from the back of your nose (nasopharynx) to your eardrum is blocked. This blockage may result from a cold, environmental allergies, or an upper respiratory infection. Unresolved barotitis media may lead to damage or hearing loss (barotrauma), which may become permanent. HOME CARE INSTRUCTIONS   Use medicines as recommended by your health care provider. Over-the-counter medicines will help unblock the canal and can help during times of air travel.  Do not put anything into your ears to clean or unplug them. Eardrops will not be helpful.  Do not swim, dive, or fly until your health care provider says it is all right to do so. If these activities are necessary, chewing gum with frequent, forceful swallowing may help. It is also helpful to hold your nose and gently blow to pop your ears for equalizing pressure changes. This forces air into the eustachian tube.  Only take over-the-counter or prescription medicines for pain, discomfort, or fever as directed by your health care provider.  A decongestant may be helpful in decongesting the middle ear and make pressure equalization easier. SEEK MEDICAL CARE IF:  You experience a serious form of dizziness in which you feel as if the room is spinning and you feel nauseated (vertigo).  Your symptoms only involve one ear. SEEK IMMEDIATE MEDICAL CARE IF:   You develop a severe headache, dizziness, or severe ear pain.  You have bloody or pus-like drainage from your ears.  You develop a fever.  Your problems do not improve or become worse. MAKE SURE YOU:   Understand these instructions.  Will watch your  condition.  Will get help right away if you are not doing well or get worse.   This information is not intended to replace advice given to you by your health care provider. Make sure you discuss any questions you have with your health care provider.   Document Released: 11/26/2000 Document Revised: 09/19/2013 Document Reviewed: 06/26/2013 Elsevier Interactive Patient Education 2016 [employer].     IF you received an x-ray today, you will receive an invoice from [facility]. Please contact [facility] at [phone] with questions or concerns regarding your invoice.   IF you received labwork today, you will receive an invoice from [employer]. Please contact [facility] at [phone] with questions or concerns regarding your invoice.   Our billing staff will not be able to assist you with questions regarding bills from these companies.  You will be contacted with the lab results as soon as they are available. The fastest way to get your results is to activate your My Chart account. Instructions are located on the last page of this paperwork. If you have not heard from [place] regarding the results in 2 weeks, please contact this office.

## 2016-05-13 NOTE — Progress Notes (Signed)
   Subjective:    Patient ID: Jonathon Fischer, male    DOB: [birth year], 55 y.o.   MRN: [id number]  HPI This is a 55 yo male who presents today for follow up of HTN. He is a Production designer, theatre/television/film.  Right ear with chronic feeling of being clogged. Has been diagnosed with eustachian tube disorder in the past and given prednisone.   Past Medical History  Diagnosis Date  . Hypertension    Past Surgical History  Procedure Laterality Date  . Femur fracture surgery      right   Family History  Problem Relation Age of Onset  . Cancer Maternal Grandfather     leukemia   Social History  Substance Use Topics  . Smoking status: Former Smoker    Quit date: 09/07/2002  . Smokeless tobacco: None  . Alcohol Use: 6.0 oz/week    12 drink(s) per week      Review of Systems No chest pain, no SOB, no edema     Objective:   Physical Exam  Constitutional: He is oriented to person, place, and time. He appears well-developed and well-nourished.  HENT:  Head: Normocephalic and atraumatic.  Right Ear: External ear normal.  Left Ear: Tympanic membrane, external ear and ear canal normal.  Mouth/Throat: Oropharynx is clear and moist.  Moderate amount cerumen.    Cardiovascular: Normal rate, regular rhythm and normal heart sounds.   Pulmonary/Chest: Effort normal and breath sounds normal.  Neurological: He is alert and oriented to person, place, and time.  Skin: Skin is warm and dry.  Psychiatric: He has a normal mood and affect. His behavior is normal. Judgment and thought content normal.  Vitals reviewed.  Right ear successfully irrigated with large amount cerumen removed. Right TM with chronic appearing perforation. No redness, no bulging, no retracting, nontender.       BP 120/76 mmHg  Pulse 68  Temp(Src) 98 F (36.7 C) (Oral)  Resp 18  Ht 5' 11.5" (1.816 m)  Wt 250 lb 3.2 oz (113.49 kg)  BMI 34.41 kg/m2  SpO2 98%  Assessment & Plan:  1. Essential hypertension - Basic  metabolic panel - Lipid panel - lisinopril-hydrochlorothiazide (PRINZIDE,ZESTORETIC) 20-25 MG tablet; Take 1 tablet by mouth daily.  Dispense: 90 tablet; Refill: 3 (he was taking 2 tablets of 20-12.5, discussed changing to 20-25).s  2. BMI [id number] - Basic metabolic panel - Lipid panel  3. Cerumen impaction, right - resolved  4. Eustachian tube disorder, unspecified laterality - discussed adding flonase daily  - follow up 6 months   Clarene Reamer, FNP-BC  [facility]  05/13/2016 2:38 PM

## 2016-05-16 ENCOUNTER — Encounter: Payer: Self-pay | Admitting: Family Medicine

## 2017-05-14 ENCOUNTER — Telehealth: Payer: Self-pay | Admitting: Family Medicine

## 2017-05-14 DIAGNOSIS — I10 Essential (primary) hypertension: Secondary | ICD-10-CM

## 2017-05-14 MED ORDER — LISINOPRIL-HYDROCHLOROTHIAZIDE 20-25 MG PO TABS: 1.0000 | ORAL_TABLET | Freq: Every day | ORAL | 0 refills | Status: DC

## 2017-05-14 NOTE — Telephone Encounter (Signed)
Pt called in saying he is completely out of his lisinopril & that he really needs it today & if we can please take care of it for him.   Please Advise

## 2017-05-14 NOTE — Telephone Encounter (Signed)
Sent to pharmacy for 30 days, he needs an appt. Last saw gessner 05/2016. Please schedule

## 2017-05-16 ENCOUNTER — Telehealth: Payer: Self-pay | Admitting: Family Medicine

## 2017-05-16 NOTE — Telephone Encounter (Signed)
LMOM TO CALL AND SET UP AN OV WITH A NEW PROVIDER FOR F/U ON BLOOD PRESSURE AND MEDICINE LAST OV WAS HERE ON 05-2016 WITH DEBBIE GESSNER WHO DOES'NT WORK HERE ANYMORE

## 2017-06-10 ENCOUNTER — Ambulatory Visit (INDEPENDENT_AMBULATORY_CARE_PROVIDER_SITE_OTHER): Payer: BLUE CROSS/BLUE SHIELD | Admitting: Family Medicine

## 2017-06-10 ENCOUNTER — Encounter: Payer: Self-pay | Admitting: Family Medicine

## 2017-06-10 VITALS — BP 136/90 | HR 68 | Temp 98.0°F | Resp 16 | Ht 71.5 in | Wt 262.4 lb

## 2017-06-10 DIAGNOSIS — I10 Essential (primary) hypertension: Secondary | ICD-10-CM

## 2017-06-10 DIAGNOSIS — Z5181 Encounter for therapeutic drug level monitoring: Secondary | ICD-10-CM | POA: Diagnosis not present

## 2017-06-10 DIAGNOSIS — Z131 Encounter for screening for diabetes mellitus: Secondary | ICD-10-CM | POA: Insufficient documentation

## 2017-06-10 DIAGNOSIS — Z1211 Encounter for screening for malignant neoplasm of colon: Secondary | ICD-10-CM

## 2017-06-10 DIAGNOSIS — E782 Mixed hyperlipidemia: Secondary | ICD-10-CM | POA: Diagnosis not present

## 2017-06-10 DIAGNOSIS — Z6836 Body mass index (BMI) 36.0-36.9, adult: Secondary | ICD-10-CM | POA: Diagnosis not present

## 2017-06-10 DIAGNOSIS — H6122 Impacted cerumen, left ear: Secondary | ICD-10-CM

## 2017-06-10 MED ORDER — LISINOPRIL-HYDROCHLOROTHIAZIDE 20-25 MG PO TABS: 1.0000 | ORAL_TABLET | Freq: Every day | ORAL | 1 refills | Status: DC

## 2017-06-10 NOTE — Patient Instructions (Addendum)
   IF you received an x-ray today, you will receive an invoice from [facility]. Please contact [facility] at [phone] with questions or concerns regarding your invoice.   IF you received labwork today, you will receive an invoice from [facility]. Please contact [facility] at [phone] with questions or concerns regarding your invoice.   Our billing staff will not be able to assist you with questions regarding bills from these companies.  You will be contacted with the lab results as soon as they are available. The fastest way to get your results is to activate your My Chart account. Instructions are located on the last page of this paperwork. If you have not heard from us regarding the results in 2 weeks, please contact this office.     Colonoscopy, Adult A colonoscopy is an exam to look at the entire large intestine. During the exam, a lubricated, bendable tube is inserted into the anus and then passed into the rectum, colon, and other parts of the large intestine. A colonoscopy is often done as a part of normal colorectal screening or in response to certain symptoms, such as anemia, persistent diarrhea, abdominal pain, and blood in the stool. The exam can help screen for and diagnose medical problems, including:  Tumors.  Polyps.  Inflammation.  Areas of bleeding.  Tell a health care provider about:  Any allergies you have.  All medicines you are taking, including vitamins, herbs, eye drops, creams, and over-the-counter medicines.  Any problems you or family members have had with anesthetic medicines.  Any blood disorders you have.  Any surgeries you have had.  Any medical conditions you have.  Any problems you have had passing stool. What are the risks? Generally, this is a safe procedure. However, problems may occur, including:  Bleeding.  A tear in the intestine.  A reaction to medicines given during the exam.  Infection (rare).  What  happens before the procedure? Eating and drinking restrictions Follow instructions from your health care provider about eating and drinking, which may include:  A few days before the procedure - follow a low-fiber diet. Avoid nuts, seeds, dried fruit, raw fruits, and vegetables.  1-3 days before the procedure - follow a clear liquid diet. Drink only clear liquids, such as clear broth or bouillon, black coffee or tea, clear juice, clear soft drinks or sports drinks, gelatin dessert, and popsicles. Avoid any liquids that contain red or purple dye.  On the day of the procedure - do not eat or drink anything during the 2 hours before the procedure, or within the time period that your health care provider recommends.  Bowel prep If you were prescribed an oral bowel prep to clean out your colon:  Take it as told by your health care provider. Starting the day before your procedure, you will need to drink a large amount of medicated liquid. The liquid will cause you to have multiple loose stools until your stool is almost clear or light green.  If your skin or anus gets irritated from diarrhea, you may use these to relieve the irritation: ? Medicated wipes, such as adult wet wipes with aloe and vitamin E. ? A skin soothing-product like petroleum jelly.  If you vomit while drinking the bowel prep, take a break for up to 60 minutes and then begin the bowel prep again. If vomiting continues and you cannot take the bowel prep without vomiting, call your health care provider.  General instructions  Ask your health care   provider about changing or stopping your regular medicines. This is especially important if you are taking diabetes medicines or blood thinners.  Plan to have someone take you home from the hospital or clinic. What happens during the procedure?  An IV tube may be inserted into one of your veins.  You will be given medicine to help you relax (sedative).  To reduce your risk of  infection: ? Your health care team will wash or sanitize their hands. ? Your anal area will be washed with soap.  You will be asked to lie on your side with your knees bent.  Your health care provider will lubricate a long, thin, flexible tube. The tube will have a camera and a light on the end.  The tube will be inserted into your anus.  The tube will be gently eased through your rectum and colon.  Air will be delivered into your colon to keep it open. You may feel some pressure or cramping.  The camera will be used to take images during the procedure.  A small tissue sample may be removed from your body to be examined under a microscope (biopsy). If any potential problems are found, the tissue will be sent to a lab for testing.  If small polyps are found, your health care provider may remove them and have them checked for cancer cells.  The tube that was inserted into your anus will be slowly removed. The procedure may vary among health care providers and hospitals. What happens after the procedure?  Your blood pressure, heart rate, breathing rate, and blood oxygen level will be monitored until the medicines you were given have worn off.  Do not drive for 24 hours after the exam.  You may have a small amount of blood in your stool.  You may pass gas and have mild abdominal cramping or bloating due to the air that was used to inflate your colon during the exam.  It is up to you to get the results of your procedure. Ask your health care provider, or the department performing the procedure, when your results will be ready. This information is not intended to replace advice given to you by your health care provider. Make sure you discuss any questions you have with your health care provider. Document Released: 11/26/2000 Document Revised: 09/29/2016 Document Reviewed: 02/10/2016 Elsevier Interactive Patient Education  2018 Elsevier Inc.  

## 2017-06-10 NOTE — Assessment & Plan Note (Signed)
Pt to follow up in one month to see if his bp can be improved with weight loss. He is motivated. Will give 30 day supply with one refill. Will increase to 90 day supply after next visit if bp is still in good range or improved.

## 2017-06-10 NOTE — Progress Notes (Signed)
Chief Complaint  Patient presents with  . Medication Refill    lisinopril    HPI  Hypertension: Patient here for follow-up of elevated blood pressure.  Pt is on lisinopril-hctz 20-25mg  He was last seen 05/2016 Lab Results  Component Value Date   CREATININE 0.79 05/13/2016   Reports that he has been compliant with his lisinopril hctz.  He is taking it at night.    He is exercising and is adherent to low salt diet.   Blood pressure is well controlled at home. Cardiac symptoms none. Patient denies chest pain, chest pressure/discomfort, claudication, exertional chest pressure/discomfort, fatigue, irregular heart beat and near-syncope.  Cardiovascular risk factors: hypertension and male gender. Use of agents associated with hypertension: none. History of target organ damage: none.   Obesity Pt reports that he changed jobs and has not been able to consistently move or eat a healthy lunch He has been gaining weight He does not have a family history of diabetes He usually eats fast food for lunch He works as a Therapist, occupational blocked He feels like his left ear is stopped up Reports that it has been that way for a few weeks He denies pain  Some reduced hearing  Screening for diabetes Pt reports that he has been gaining weight Denies polyuria or polydipsia  Past Medical History:  Diagnosis Date  . Hypertension     Current Outpatient Prescriptions  Medication Sig Dispense Refill  . lisinopril-hydrochlorothiazide (PRINZIDE,ZESTORETIC) 20-25 MG tablet Take 1 tablet by mouth daily. 30 tablet 1   No current facility-administered medications for this visit.     Allergies: No Known Allergies  Past Surgical History:  Procedure Laterality Date  . FEMUR FRACTURE SURGERY     right    Social History   Social History  . Marital status: Single    Spouse name: N/A  . Number of children: N/A  . Years of education: N/A   Social History Main Topics  . Smoking status:  Former Smoker    Quit date: 09/07/2002  . Smokeless tobacco: Never Used  . Alcohol use 6.0 oz/week    12 Standard drinks or equivalent per week  . Drug use: No  . Sexual activity: Not Asked   Other Topics Concern  . None   Social History Narrative  . None    ROS See hpi  Objective: Vitals:   06/10/17 1330 06/10/17 1332  BP: (!) 142/92 136/90  Pulse: 68   Resp: 16   Temp: 98 F (36.7 C)   TempSrc: Oral   SpO2: 97%   Weight: 262 lb 6.4 oz (119 kg)   Height: 5' 11.5" (1.816 m)    Body mass index is 36.09 kg/m. Wt Readings from Last 3 Encounters:  06/10/17 262 lb 6.4 oz (119 kg)  05/13/16 250 lb 3.2 oz (113.5 kg)  08/11/15 243 lb (110.2 kg)    Physical Exam  Constitutional: He is oriented to person, place, and time. He appears well-developed and well-nourished.  HENT:  Head: Normocephalic and atraumatic.  Left ear with impacted cerumen, tm not seen  Eyes: Conjunctivae and EOM are normal.  Cardiovascular: Normal rate, regular rhythm and normal heart sounds.   No murmur heard. Pulmonary/Chest: Effort normal and breath sounds normal. No respiratory distress. He has no wheezes.  Musculoskeletal: He exhibits no edema.  Neurological: He is alert and oriented to person, place, and time.  Psychiatric: He has a normal mood and affect. His behavior is normal. Judgment  and thought content normal.    Assessment and Plan  Problem List Items Addressed This Visit      Cardiovascular and Mediastinum   Essential hypertension - Primary    Pt to follow up in one month to see if his bp can be improved with weight loss. He is motivated. Will give 30 day supply with one refill. Will increase to 90 day supply after next visit if bp is still in good range or improved.      Relevant Medications   lisinopril-hydrochlorothiazide (PRINZIDE,ZESTORETIC) 20-25 MG tablet   Other Relevant Orders   Comprehensive metabolic panel   HCV Ab w/Rflx to Verification   Lipid panel     Nervous  and Auditory   Impacted cerumen, left ear    Ear lavage performed today      Relevant Orders   Ear wax removal     Other   Hyperlipidemia    Will check levels with FLP      Relevant Medications   lisinopril-hydrochlorothiazide (PRINZIDE,ZESTORETIC) 20-25 MG tablet   Other Relevant Orders   Comprehensive metabolic panel   HCV Ab w/Rflx to Verification   Lipid panel   BMI 36.0-36.9,adult    Discussed planning 2 lunches per day to pack from home and exercising 15 minutes every day. Pt will work on weight loss      Relevant Orders   Hemoglobin A1c   Screening for diabetes mellitus   Relevant Orders   Hemoglobin A1c    Other Visit Diagnoses    Encounter for medication monitoring       Relevant Orders   Comprehensive metabolic panel   HCV Ab w/Rflx to Verification   Lipid panel   Encounter for screening colonoscopy       Relevant Orders   HM COLONOSCOPY (Completed)       Zoe A Nolon Rod

## 2017-06-10 NOTE — Assessment & Plan Note (Signed)
Discussed planning 2 lunches per day to pack from home and exercising 15 minutes every day. Pt will work on weight loss

## 2017-06-10 NOTE — Assessment & Plan Note (Signed)
Ear lavage performed today

## 2017-06-10 NOTE — Assessment & Plan Note (Signed)
Will check levels with FLP

## 2017-06-11 LAB — COMPREHENSIVE METABOLIC PANEL
ALT: 26 IU/L (ref 0–44)
AST: 22 IU/L (ref 0–40)
Albumin/Globulin Ratio: 1.8 (ref 1.2–2.2)
Albumin: 4.2 g/dL (ref 3.5–5.5)
Alkaline Phosphatase: 40 IU/L (ref 39–117)
BUN/Creatinine Ratio: 16 (ref 9–20)
BUN: 16 mg/dL (ref 6–24)
Bilirubin Total: 0.5 mg/dL (ref 0.0–1.2)
CO2: 24 mmol/L (ref 20–29)
Calcium: 9 mg/dL (ref 8.7–10.2)
Chloride: 99 mmol/L (ref 96–106)
Creatinine, Ser: 0.99 mg/dL (ref 0.76–1.27)
GFR calc Af Amer: 99 mL/min/{1.73_m2} (ref >59)
GFR calc non Af Amer: 86 mL/min/{1.73_m2} (ref >59)
Globulin, Total: 2.3 g/dL (ref 1.5–4.5)
Glucose: 95 mg/dL (ref 65–99)
Potassium: 4.7 mmol/L (ref 3.5–5.2)
Sodium: 139 mmol/L (ref 134–144)
Total Protein: 6.5 g/dL (ref 6.0–8.5)

## 2017-06-11 LAB — HEMOGLOBIN A1C
Est. average glucose Bld gHb Est-mCnc: 114 mg/dL
Hgb A1c MFr Bld: 5.6 % (ref 4.8–5.6)

## 2017-06-11 LAB — HCV INTERPRETATION

## 2017-06-11 LAB — LIPID PANEL
Chol/HDL Ratio: 2.9 ratio (ref 0.0–5.0)
Cholesterol, Total: 169 mg/dL (ref 100–199)
HDL: 59 mg/dL (ref >39)
LDL Calculated: 95 mg/dL (ref 0–99)
Triglycerides: 77 mg/dL (ref 0–149)
VLDL Cholesterol Cal: 15 mg/dL (ref 5–40)

## 2017-06-11 LAB — HCV AB W/RFLX TO VERIFICATION: HCV Ab: <0.1 s/co ratio (ref 0.0–0.9)

## 2017-06-21 ENCOUNTER — Encounter: Payer: Self-pay | Admitting: Family Medicine

## 2017-07-13 ENCOUNTER — Ambulatory Visit: Payer: BLUE CROSS/BLUE SHIELD | Admitting: Family Medicine

## 2017-08-10 ENCOUNTER — Encounter: Payer: Self-pay | Admitting: Family Medicine

## 2017-08-10 ENCOUNTER — Ambulatory Visit (INDEPENDENT_AMBULATORY_CARE_PROVIDER_SITE_OTHER): Payer: BLUE CROSS/BLUE SHIELD | Admitting: Family Medicine

## 2017-08-10 DIAGNOSIS — I10 Essential (primary) hypertension: Secondary | ICD-10-CM

## 2017-08-10 MED ORDER — LISINOPRIL-HYDROCHLOROTHIAZIDE 20-25 MG PO TABS: 1.0000 | ORAL_TABLET | Freq: Every day | ORAL | 1 refills | Status: DC

## 2017-08-10 NOTE — Patient Instructions (Signed)
     IF you received an x-ray today, you will receive an invoice from [facility]. Please contact [facility] at [phone] with questions or concerns regarding your invoice.   IF you received labwork today, you will receive an invoice from [facility]. Please contact [facility] at [phone] with questions or concerns regarding your invoice.   Our billing staff will not be able to assist you with questions regarding bills from these companies.  You will be contacted with the lab results as soon as they are available. The fastest way to get your results is to activate your My Chart account. Instructions are located on the last page of this paperwork. If you have not heard from us regarding the results in 2 weeks, please contact this office.     

## 2017-08-10 NOTE — Progress Notes (Signed)
  Chief Complaint  Patient presents with  . Medication Refill    LISINOPRIL/HCTZ 20-25 MG    HPI  Hypertension: Patient here for follow-up of elevated blood pressure. He is exercising and is adherent to low salt diet.  Blood pressure is well controlled at home. Cardiac symptoms none. Patient denies chest pain, claudication, exertional chest pressure/discomfort, irregular heart beat and near-syncope.  Cardiovascular risk factors: hypertension and sedentary lifestyle. Use of agents associated with hypertension: none. History of target organ damage: none.  BP Readings from Last 3 Encounters:  08/10/17 (!) 146/96  06/10/17 136/90  05/13/16 120/76    He reports that he has not had any opportunities to implement any changes to modify his weight  Wt Readings from Last 3 Encounters:  08/10/17 269 lb 6.4 oz (122.2 kg)  06/10/17 262 lb 6.4 oz (119 kg)  05/13/16 250 lb 3.2 oz (113.5 kg)      Past Medical History:  Diagnosis Date  . Hypertension     Current Outpatient Prescriptions  Medication Sig Dispense Refill  . lisinopril-hydrochlorothiazide (PRINZIDE,ZESTORETIC) 20-25 MG tablet Take 1 tablet by mouth daily. 90 tablet 1   No current facility-administered medications for this visit.     Allergies: No Known Allergies  Past Surgical History:  Procedure Laterality Date  . FEMUR FRACTURE SURGERY     right    Social History   Social History  . Marital status: Single    Spouse name: N/A  . Number of children: N/A  . Years of education: N/A   Social History Main Topics  . Smoking status: Former Smoker    Quit date: 09/07/2002  . Smokeless tobacco: Never Used  . Alcohol use 6.0 oz/week    12 Standard drinks or equivalent per week  . Drug use: No  . Sexual activity: Not Asked   Other Topics Concern  . None   Social History Narrative  . None   Family History  Problem Relation Age of Onset  . Cancer Maternal Grandfather        leukemia    Review of Systems    Constitutional: Negative for chills and fever.  Cardiovascular: Negative for chest pain, palpitations and orthopnea.  Gastrointestinal: Negative for abdominal pain, diarrhea, nausea and vomiting.  Skin: Negative for itching and rash.  Neurological: Negative for dizziness and headaches.    Objective: Vitals:   08/10/17 1632  BP: (!) 146/96  Pulse: 81  Resp: 17  Temp: 98.5 F (36.9 C)  TempSrc: Oral  SpO2: 97%  Weight: 269 lb 6.4 oz (122.2 kg)  Height: 5' 11.5" (1.816 m)    Physical Exam  Constitutional: He is oriented to person, place, and time. He appears well-developed and well-nourished.  HENT:  Head: Normocephalic and atraumatic.  Cardiovascular: Normal rate, regular rhythm and normal heart sounds.  No murmur heard. Pulmonary/Chest: Effort normal and breath sounds normal. No stridor. No respiratory distress.  Neurological: He is alert and oriented to person, place, and time.    Assessment and Plan Kamarrion was seen today for medication refill.  Diagnoses and all orders for this visit:  Essential hypertension -     lisinopril-hydrochlorothiazide (PRINZIDE,ZESTORETIC) 20-25 MG tablet; Take 1 tablet by mouth daily.  Other orders -     Cancel: Flu Vaccine QUAD 36+ mos IM -     Cancel: Tdap vaccine greater than or equal to 7yo IM     Franci Oshana A [employer]

## 2017-12-28 ENCOUNTER — Telehealth: Payer: Self-pay | Admitting: Family Medicine

## 2017-12-28 NOTE — Telephone Encounter (Signed)
Called to reschedule pt for [facility] appt on 02/06/18.  When pt calls in please make him an appt.  Thanks!

## 2018-02-06 ENCOUNTER — Ambulatory Visit: Payer: BLUE CROSS/BLUE SHIELD | Admitting: Family Medicine

## 2018-02-09 ENCOUNTER — Other Ambulatory Visit: Payer: Self-pay | Admitting: Family Medicine

## 2018-02-09 DIAGNOSIS — I10 Essential (primary) hypertension: Secondary | ICD-10-CM

## 2018-02-09 NOTE — Telephone Encounter (Signed)
Refill request for lisinopril/hctz 20-25 mg #30 with no refills approved.  No more refills without ov.  Will route to schedulers to schedule htn f/u with [facility]. Dgaddy, CMA

## 2018-02-10 NOTE — Telephone Encounter (Signed)
Talked to Jonathon Fischer. Informed him of the refill sent in by Dr. Nolon Rod. He made an appt for tomorrow 02/11/18 at 12:00

## 2018-02-11 ENCOUNTER — Ambulatory Visit: Payer: BLUE CROSS/BLUE SHIELD | Admitting: Family Medicine

## 2018-02-13 ENCOUNTER — Ambulatory Visit: Payer: BLUE CROSS/BLUE SHIELD | Admitting: Family Medicine

## 2018-02-13 ENCOUNTER — Encounter: Payer: Self-pay | Admitting: Family Medicine

## 2018-02-13 ENCOUNTER — Other Ambulatory Visit: Payer: Self-pay

## 2018-02-13 ENCOUNTER — Ambulatory Visit (INDEPENDENT_AMBULATORY_CARE_PROVIDER_SITE_OTHER): Payer: BLUE CROSS/BLUE SHIELD

## 2018-02-13 VITALS — BP 132/94 | HR 89 | Temp 98.6°F | Resp 16 | Ht 71.5 in | Wt 264.8 lb

## 2018-02-13 DIAGNOSIS — N5201 Erectile dysfunction due to arterial insufficiency: Secondary | ICD-10-CM

## 2018-02-13 DIAGNOSIS — I10 Essential (primary) hypertension: Secondary | ICD-10-CM | POA: Diagnosis not present

## 2018-02-13 DIAGNOSIS — M25551 Pain in right hip: Secondary | ICD-10-CM | POA: Diagnosis not present

## 2018-02-13 DIAGNOSIS — E782 Mixed hyperlipidemia: Secondary | ICD-10-CM

## 2018-02-13 DIAGNOSIS — Z23 Encounter for immunization: Secondary | ICD-10-CM

## 2018-02-13 DIAGNOSIS — Z5181 Encounter for therapeutic drug level monitoring: Secondary | ICD-10-CM | POA: Diagnosis not present

## 2018-02-13 MED ORDER — HYDROCHLOROTHIAZIDE 25 MG PO TABS: 25.0000 mg | ORAL_TABLET | Freq: Every day | ORAL | 1 refills | Status: DC

## 2018-02-13 MED ORDER — SILDENAFIL CITRATE 50 MG PO TABS: 50.0000 mg | ORAL_TABLET | Freq: Every day | ORAL | 0 refills | Status: DC | PRN

## 2018-02-13 MED ORDER — LISINOPRIL 40 MG PO TABS: 40.0000 mg | ORAL_TABLET | Freq: Every day | ORAL | 1 refills | Status: DC
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
## 2018-02-13 NOTE — Patient Instructions (Addendum)
Take 1/2 tablet to see if that works  Then increase to full tablet if needed If that does not work then increase to 2 tablets   Erectile Dysfunction Erectile dysfunction (ED) is the inability to get or keep an erection in order to have sexual intercourse. Erectile dysfunction may include:  Inability to get an erection.  Lack of enough hardness of the erection to allow penetration.  Loss of the erection before sex is finished.  What are the causes? This condition may be caused by:  Certain medicines, such as: ? Pain relievers. ? Antihistamines. ? Antidepressants. ? Blood pressure medicines. ? Water pills (diuretics). ? Ulcer medicines. ? Muscle relaxants. ? Drugs.  Excessive drinking.  Psychological causes, such as: ? Anxiety. ? Depression. ? Sadness. ? Exhaustion. ? Performance fear. ? Stress.  Physical causes, such as: ? Artery problems. This may include diabetes, smoking, liver disease, or atherosclerosis. ? High blood pressure. ? Hormonal problems, such as low testosterone. ? Obesity. ? Nerve problems. This may include back or pelvic injuries, diabetes mellitus, multiple sclerosis, or Parkinson disease.  What are the signs or symptoms? Symptoms of this condition include:  Inability to get an erection.  Lack of enough hardness of the erection to allow penetration.  Loss of the erection before sex is finished.  Normal erections at some times, but with frequent unsatisfactory episodes.  Low sexual satisfaction in either partner due to erection problems.  A curved penis occurring with erection. The curve may cause pain or the penis may be too curved to allow for intercourse.  Never having nighttime erections.  How is this diagnosed? This condition is often diagnosed by:  Performing a physical exam to find other diseases or specific problems with the penis.  Asking you detailed questions about the problem.  Performing blood tests to check for diabetes  mellitus or to measure hormone levels.  Performing other tests to check for underlying health conditions.  Performing an ultrasound exam to check for scarring.  Performing a test to check blood flow to the penis.  Doing a sleep study at home to measure nighttime erections.  How is this treated? This condition may be treated by:  Medicine taken by mouth to help you achieve an erection (oral medicine).  Hormone replacement therapy to replace low testosterone levels.  Medicine that is injected into the penis. Your health care provider may instruct you how to give yourself these injections at home.  Vacuum pump. This is a pump with a ring on it. The pump and ring are placed on the penis and used to create pressure that helps the penis become erect.  Penile implant surgery. In this procedure, you may receive: ? An inflatable implant. This consists of cylinders, a pump, and a reservoir. The cylinders can be inflated with a fluid that helps to create an erection, and they can be deflated after intercourse. ? A semi-rigid implant. This consists of two silicone rubber rods. The rods provide some rigidity. They are also flexible, so the penis can both curve downward in its normal position and become straight for sexual intercourse.  Blood vessel surgery, to improve blood flow to the penis. During this procedure, a blood vessel from a different part of the body is placed into the penis to allow blood to flow around (bypass) damaged or blocked blood vessels.  Lifestyle changes, such as exercising more, losing weight, and quitting smoking.  Follow these instructions at home: Medicines  Take over-the-counter and prescription medicines only as  told by your health care provider. Do not increase the dosage without first discussing it with your health care provider.  If you are using self-injections, perform injections as directed by your health care provider. Make sure to avoid any veins that are on  the surface of the penis. After giving an injection, apply pressure to the injection site for 5 minutes. General instructions  Exercise regularly, as directed by your health care provider. Work with your health care provider to lose weight, if needed.  Do not use any products that contain nicotine or tobacco, such as cigarettes and e-cigarettes. If you need help quitting, ask your health care provider.  Before using a vacuum pump, read the instructions that come with the pump and discuss any questions with your health care provider.  Keep all follow-up visits as told by your health care provider. This is important. Contact a health care provider if:  You feel nauseous.  You vomit. Get help right away if:  You are taking oral or injectable medicines and you have an erection that lasts longer than 4 hours. If your health care provider is unavailable, go to the nearest emergency room for evaluation. An erection that lasts much longer than 4 hours can result in permanent damage to your penis.  You have severe pain in your groin or abdomen.  You develop redness or severe swelling of your penis.  You have redness spreading up into your groin or lower abdomen.  You are unable to urinate.  You experience chest pain or a rapid heart beat (palpitations) after taking oral medicines. Summary  Erectile dysfunction (ED) is the inability to get or keep an erection during sexual intercourse. This problem can usually be treated successfully.  This condition is diagnosed based on a physical exam, your symptoms, and tests to determine the cause. Treatment varies depending on the cause, and may include medicines, hormone therapy, surgery, or vacuum pump.  You may need follow-up visits to make sure that you are using your medicines or devices correctly.  Get help right away if you are taking or injecting medicines and you have an erection that lasts longer than 4 hours. This information is not  intended to replace advice given to you by your health care provider. Make sure you discuss any questions you have with your health care provider. Document Released: 11/26/2000 Document Revised: 12/15/2016 Document Reviewed: 12/15/2016 Elsevier Interactive Patient Education  2017 [employer].

## 2018-02-14 LAB — COMPREHENSIVE METABOLIC PANEL
ALT: 47 IU/L — ABNORMAL HIGH (ref 0–44)
AST: 27 IU/L (ref 0–40)
Albumin/Globulin Ratio: 2.2 (ref 1.2–2.2)
Albumin: 4.7 g/dL (ref 3.5–5.5)
Alkaline Phosphatase: 44 IU/L (ref 39–117)
BUN/Creatinine Ratio: 12 (ref 9–20)
BUN: 12 mg/dL (ref 6–24)
Bilirubin Total: 0.5 mg/dL (ref 0.0–1.2)
CO2: 23 mmol/L (ref 20–29)
Calcium: 9.8 mg/dL (ref 8.7–10.2)
Chloride: 102 mmol/L (ref 96–106)
Creatinine, Ser: 0.98 mg/dL (ref 0.76–1.27)
GFR calc Af Amer: 100 mL/min/{1.73_m2} (ref >59)
GFR calc non Af Amer: 86 mL/min/{1.73_m2} (ref >59)
Globulin, Total: 2.1 g/dL (ref 1.5–4.5)
Glucose: 101 mg/dL — ABNORMAL HIGH (ref 65–99)
Potassium: 4.6 mmol/L (ref 3.5–5.2)
Sodium: 141 mmol/L (ref 134–144)
Total Protein: 6.8 g/dL (ref 6.0–8.5)

## 2018-02-14 LAB — LIPID PANEL
Chol/HDL Ratio: 3.6 ratio (ref 0.0–5.0)
Cholesterol, Total: 185 mg/dL (ref 100–199)
HDL: 52 mg/dL (ref >39)
LDL Calculated: 115 mg/dL — ABNORMAL HIGH (ref 0–99)
Triglycerides: 90 mg/dL (ref 0–149)
VLDL Cholesterol Cal: 18 mg/dL (ref 5–40)

## 2018-02-20 ENCOUNTER — Telehealth: Payer: Self-pay

## 2018-02-20 NOTE — Telephone Encounter (Signed)
Phone call to patient. Relayed message from provider regarding xray and labs. He states that he's very uncomfortable, when should he expect to hear from orthopedics? Patient advised message will be routed to referrals team to follow up. Please call back if any worsening pain or further questions/concerns. He is agreeable, verbalizes understanding.

## 2018-02-20 NOTE — Telephone Encounter (Signed)
Advise patient that his results were normal. The triglycerides were good. Normal lipids.  The liver enzymes were normal.  The kidney function tests are also normal.

## 2018-02-20 NOTE — Telephone Encounter (Signed)
Provider, please release results and advise.

## 2018-02-20 NOTE — Telephone Encounter (Signed)
Xray shows chronic degenerative changes. The referral for Orthopedics was placed.

## 2018-02-20 NOTE — Telephone Encounter (Signed)
Copied from [place] [id number]. Topic: Quick Communication - Lab Results >> Feb 20, 2018 12:44 PM Oliver Pila B wrote: Pt called to be notified of the xray results of his leg, pt states he is in pain, contacdt pt to advise

## 2018-02-27 DIAGNOSIS — M25551 Pain in right hip: Secondary | ICD-10-CM | POA: Diagnosis not present

## 2018-03-20 DIAGNOSIS — M25551 Pain in right hip: Secondary | ICD-10-CM | POA: Diagnosis not present

## 2018-05-17 DIAGNOSIS — M25551 Pain in right hip: Secondary | ICD-10-CM | POA: Diagnosis not present

## 2018-06-19 DIAGNOSIS — M25551 Pain in right hip: Secondary | ICD-10-CM | POA: Diagnosis not present

## 2018-06-19 DIAGNOSIS — M619 Calcification and ossification of muscle, unspecified: Secondary | ICD-10-CM | POA: Diagnosis not present

## 2018-08-03 DIAGNOSIS — M25551 Pain in right hip: Secondary | ICD-10-CM | POA: Diagnosis not present

## 2018-08-03 DIAGNOSIS — M7061 Trochanteric bursitis, right hip: Secondary | ICD-10-CM | POA: Diagnosis not present

## 2018-08-03 DIAGNOSIS — M7541 Impingement syndrome of right shoulder: Secondary | ICD-10-CM | POA: Diagnosis not present

## 2018-08-16 ENCOUNTER — Other Ambulatory Visit: Payer: Self-pay | Admitting: Family Medicine

## 2018-08-22 NOTE — Progress Notes (Signed)
Chief Complaint  Patient presents with  . Blood Pressure Check    f/u from 02/13/18    HPI   Hypertension: Patient here for follow-up of elevated blood pressure. He is exercising rarely and is adherent to low salt diet.  Blood pressure is not well controlled at home. Cardiac symptoms none. Patient denies chest pain, chest pressure/discomfort, claudication, dyspnea, exertional chest pressure/discomfort, fatigue and irregular heart beat.  Cardiovascular risk factors: advanced age (older than 29 for men, 76 for women) and hypertension. Use of agents associated with hypertension: none. History of target organ damage: none. BP Readings from Last 3 Encounters:  08/23/18 134/88  02/13/18 (!) 132/94  08/10/17 (!) 151/91   Wt Readings from Last 3 Encounters:  08/23/18 261 lb 9.6 oz (118.7 kg)  02/13/18 264 lb 12.8 oz (120.1 kg)  08/10/17 269 lb 6.4 oz (122.2 kg)   Lab Results  Component Value Date   CREATININE 0.98 02/13/2018   Lab Results  Component Value Date   HGBA1C 5.6 06/10/2017    Right shoulder pain Patient reports that he threw a punch overhead and states that he has been having a click in the shoulder He states that since that time he has been having some discomfort when applying deodarant This started a month ago He denies weakness in the hand or arm His range of motion is normal   Hyperlipidemia Lab Results  Component Value Date   CHOL 185 02/13/2018   CHOL 169 06/10/2017   CHOL 167 05/13/2016   Lab Results  Component Value Date   HDL 52 02/13/2018   HDL 59 06/10/2017   HDL 61 05/13/2016   Lab Results  Component Value Date   LDLCALC 115 (H) 02/13/2018   LDLCALC 95 06/10/2017   LDLCALC 92 05/13/2016   Lab Results  Component Value Date   TRIG 90 02/13/2018   TRIG 77 06/10/2017   TRIG 68 05/13/2016   Lab Results  Component Value Date   CHOLHDL 3.6 02/13/2018   CHOLHDL 2.9 06/10/2017   CHOLHDL 2.7 05/13/2016   No results found for: LDLDIRECT  He  states that he has been watching out for high cholesterol foods or fried foods He states that he has not been exercise He denies any meds that would contribute to high cholesterol   Past Medical History:  Diagnosis Date  . Hypertension     Current Outpatient Medications  Medication Sig Dispense Refill  . hydrochlorothiazide (HYDRODIURIL) 25 MG tablet TAKE 1 TABLET BY MOUTH ONCE DAILY 90 tablet 1  . lisinopril (PRINIVIL,ZESTRIL) 40 MG tablet Take 1 tablet (40 mg total) by mouth daily. 90 tablet 1  . sildenafil (VIAGRA) 50 MG tablet Take 1 tablet (50 mg total) by mouth daily as needed for erectile dysfunction. (Patient not taking: Reported on 08/23/2018) 5 tablet 0   No current facility-administered medications for this visit.     Allergies: No Known Allergies  Past Surgical History:  Procedure Laterality Date  . FEMUR FRACTURE SURGERY     right    Social History   Socioeconomic History  . Marital status: Single    Spouse name: Not on file  . Number of children: Not on file  . Years of education: Not on file  . Highest education level: Not on file  Occupational History  . Not on file  Social Needs  . Financial resource strain: Not on file  . Food insecurity:    Worry: Not on file    Inability: Not on  file  . Transportation needs:    Medical: Not on file    Non-medical: Not on file  Tobacco Use  . Smoking status: Former Smoker    Last attempt to quit: 09/07/2002    Years since quitting: 15.9  . Smokeless tobacco: Never Used  Substance and Sexual Activity  . Alcohol use: Yes    Alcohol/week: 12.0 standard drinks    Types: 12 Standard drinks or equivalent per week  . Drug use: No  . Sexual activity: Not on file  Lifestyle  . Physical activity:    Days per week: Not on file    Minutes per session: Not on file  . Stress: Not on file  Relationships  . Social connections:    Talks on phone: Not on file    Gets together: Not on file    Attends religious service:  Not on file    Active member of club or organization: Not on file    Attends meetings of clubs or organizations: Not on file    Relationship status: Not on file  Other Topics Concern  . Not on file  Social History Narrative  . Not on file    Family History  Problem Relation Age of Onset  . Cancer Maternal Grandfather        leukemia     ROS Review of Systems See HPI Constitution: No fevers or chills No malaise No diaphoresis Skin: No rash or itching Eyes: no blurry vision, no double vision GU: no dysuria or hematuria Neuro: no dizziness or headaches all others reviewed and negative   Objective: Vitals:   08/23/18 1310 08/23/18 1355  BP: (!) 141/92 134/88  Pulse: 92   Resp: 16   Temp: 99.4 F (37.4 C)   TempSrc: Oral   SpO2: 98%   Weight: 261 lb 9.6 oz (118.7 kg)   Height: 5' 11.5" (1.816 m)     Physical Exam  Constitutional: He is oriented to person, place, and time. He appears well-developed and well-nourished.  HENT:  Head: Normocephalic and atraumatic.  Eyes: Conjunctivae and EOM are normal.  Cardiovascular: Normal rate, regular rhythm and normal heart sounds.  No murmur heard. Pulmonary/Chest: Effort normal and breath sounds normal. No stridor. No respiratory distress. He has no wheezes.  Abdominal: Soft. Bowel sounds are normal. He exhibits no distension and no mass. There is no tenderness. There is no guarding.  Musculoskeletal: Normal range of motion.       Right shoulder: He exhibits tenderness and pain. He exhibits normal range of motion, no bony tenderness, no swelling, no effusion, no crepitus, no deformity, no laceration, no spasm, normal pulse and normal strength.  Neurological: He is alert and oriented to person, place, and time.  Skin: Skin is warm. Capillary refill takes less than 2 seconds.  Psychiatric: He has a normal mood and affect. His behavior is normal. Judgment and thought content normal.    Assessment and Plan Jonathon Fischer was seen today  for blood pressure check.  Diagnoses and all orders for this visit:  Mixed hyperlipidemia- Discussed medications that affect lipids Reminded patient to avoid grapefruits Reviewed last 3 lipids Discussed current meds: statin, aspirin Advised dietary fiber and fish oil and ways to keep HDL high CAD prevention and reviewed side effects of statins  -     Comprehensive metabolic panel -     Lipid panel  Essential hypertension- Patient's blood pressure is at goal of 139/89 or less. Condition is stable. Continue current medications and  treatment plan. I recommend that you exercise for 30-45 minutes 5 days a week. I also recommend a balanced diet with fruits and vegetables every day, lean meats, and little fried foods. The DASH diet (you can find this online) is a good example of this.  -     Comprehensive metabolic panel -     Lipid panel  Screening for diabetes mellitus -     Comprehensive metabolic panel -     Hemoglobin A1c  BMI 36.0-36.9,adult- improved slightly -     Lipid panel -     Hemoglobin V4C  Clicking right shoulder- imoroving Normal exam except for slight discomfort over the biceps tendon and the anterior shoulder      Jonathon Fischer A Nolon Rod

## 2018-08-23 ENCOUNTER — Encounter: Payer: Self-pay | Admitting: Family Medicine

## 2018-08-23 ENCOUNTER — Other Ambulatory Visit: Payer: Self-pay

## 2018-08-23 ENCOUNTER — Ambulatory Visit: Payer: BLUE CROSS/BLUE SHIELD | Admitting: Family Medicine

## 2018-08-23 VITALS — BP 134/88 | HR 92 | Temp 99.4°F | Resp 16 | Ht 71.5 in | Wt 261.6 lb

## 2018-08-23 DIAGNOSIS — M25811 Other specified joint disorders, right shoulder: Secondary | ICD-10-CM

## 2018-08-23 DIAGNOSIS — E782 Mixed hyperlipidemia: Secondary | ICD-10-CM

## 2018-08-23 DIAGNOSIS — I10 Essential (primary) hypertension: Secondary | ICD-10-CM

## 2018-08-23 DIAGNOSIS — Z131 Encounter for screening for diabetes mellitus: Secondary | ICD-10-CM | POA: Diagnosis not present

## 2018-08-23 DIAGNOSIS — Z6836 Body mass index (BMI) 36.0-36.9, adult: Secondary | ICD-10-CM | POA: Diagnosis not present

## 2018-08-23 MED ORDER — LISINOPRIL 40 MG PO TABS: 40.0000 mg | ORAL_TABLET | Freq: Every day | ORAL | 1 refills | Status: DC

## 2018-08-23 NOTE — Patient Instructions (Addendum)
If you have lab work done today you will be contacted with your lab results within the next 2 weeks.  If you have not heard from [place] then please contact us. The fastest way to get your results is to register for My Chart.   IF you received an x-ray today, you will receive an invoice from [facility]. Please contact [facility] at [phone] with questions or concerns regarding your invoice.   IF you received labwork today, you will receive an invoice from [facility]. Please contact [facility] at [phone] with questions or concerns regarding your invoice.   Our billing staff will not be able to assist you with questions regarding bills from these companies.  You will be contacted with the lab results as soon as they are available. The fastest way to get your results is to activate your My Chart account. Instructions are located on the last page of this paperwork. If you have not heard from [place] regarding the results in 2 weeks, please contact this office.     Shoulder Exercises Ask your health care provider which exercises are safe for you. Do exercises exactly as told by your health care provider and adjust them as directed. It is normal to feel mild stretching, pulling, tightness, or discomfort as you do these exercises, but you should stop right away if you feel sudden pain or your pain gets worse.Do not begin these exercises until told by your health care provider. RANGE OF MOTION EXERCISES These exercises warm up your muscles and joints and improve the movement and flexibility of your shoulder. These exercises also help to relieve pain, numbness, and tingling. These exercises involve stretching your injured shoulder directly. Exercise A: Pendulum  1. Stand near a wall or a surface that you can hold onto for balance. 2. Bend at the waist and let your left / right arm hang straight down. Use your other arm to support you. Keep your back straight and do not lock your  knees. 3. Relax your left / right arm and shoulder muscles, and move your hips and your trunk so your left / right arm swings freely. Your arm should swing because of the motion of your body, not because you are using your arm or shoulder muscles. 4. Keep moving your body so your arm swings in the following directions, as told by your health care provider: ? Side to side. ? Forward and backward. ? In clockwise and counterclockwise circles. 5. Continue each motion for __________ seconds, or for as long as told by your health care provider. 6. Slowly return to the starting position. Repeat __________ times. Complete this exercise __________ times a day. Exercise B:Flexion, Standing  1. Stand and hold a broomstick, a cane, or a similar object. Place your hands a little more than shoulder-width apart on the object. Your left / right hand should be palm-up, and your other hand should be palm-down. 2. Keep your elbow straight and keep your shoulder muscles relaxed. Push the stick down with your healthy arm to raise your left / right arm in front of your body, and then over your head until you feel a stretch in your shoulder. ? Avoid shrugging your shoulder while you raise your arm. Keep your shoulder blade tucked down toward the middle of your back. 3. Hold for __________ seconds. 4. Slowly return to the starting position. Repeat __________ times. Complete this exercise __________ times a day. Exercise C: Abduction, Standing 1. Stand and hold a broomstick,  a cane, or a similar object. Place your hands a little more than shoulder-width apart on the object. Your left / right hand should be palm-up, and your other hand should be palm-down. 2. While keeping your elbow straight and your shoulder muscles relaxed, push the stick across your body toward your left / right side. Raise your left / right arm to the side of your body and then over your head until you feel a stretch in your shoulder. ? Do not raise  your arm above shoulder height, unless your health care provider tells you to do that. ? Avoid shrugging your shoulder while you raise your arm. Keep your shoulder blade tucked down toward the middle of your back. 3. Hold for __________ seconds. 4. Slowly return to the starting position. Repeat __________ times. Complete this exercise __________ times a day. Exercise D:Internal Rotation  1. Place your left / right hand behind your back, palm-up. 2. Use your other hand to dangle an exercise band, a towel, or a similar object over your shoulder. Grasp the band with your left / right hand so you are holding onto both ends. 3. Gently pull up on the band until you feel a stretch in the front of your left / right shoulder. ? Avoid shrugging your shoulder while you raise your arm. Keep your shoulder blade tucked down toward the middle of your back. 4. Hold for __________ seconds. 5. Release the stretch by letting go of the band and lowering your hands. Repeat __________ times. Complete this exercise __________ times a day. STRETCHING EXERCISES These exercises warm up your muscles and joints and improve the movement and flexibility of your shoulder. These exercises also help to relieve pain, numbness, and tingling. These exercises are done using your healthy shoulder to help stretch the muscles of your injured shoulder. Exercise E: Warehouse manager (External Rotation and Abduction)  1. Stand in a doorway with one of your feet slightly in front of the other. This is called a staggered stance. If you cannot reach your forearms to the door frame, stand facing a corner of a room. 2. Choose one of the following positions as told by your health care provider: ? Place your hands and forearms on the door frame above your head. ? Place your hands and forearms on the door frame at the height of your head. ? Place your hands on the door frame at the height of your elbows. 3. Slowly move your weight onto your  front foot until you feel a stretch across your chest and in the front of your shoulders. Keep your head and chest upright and keep your abdominal muscles tight. 4. Hold for __________ seconds. 5. To release the stretch, shift your weight to your back foot. Repeat __________ times. Complete this stretch __________ times a day. Exercise F:Extension, Standing 1. Stand and hold a broomstick, a cane, or a similar object behind your back. ? Your hands should be a little wider than shoulder-width apart. ? Your palms should face away from your back. 2. Keeping your elbows straight and keeping your shoulder muscles relaxed, move the stick away from your body until you feel a stretch in your shoulder. ? Avoid shrugging your shoulders while you move the stick. Keep your shoulder blade tucked down toward the middle of your back. 3. Hold for __________ seconds. 4. Slowly return to the starting position. Repeat __________ times. Complete this exercise __________ times a day. STRENGTHENING EXERCISES These exercises build strength and endurance in your  shoulder. Endurance is the ability to use your muscles for a long time, even after they get tired. Exercise G:External Rotation  1. Sit in a stable chair without armrests. 2. Secure an exercise band at elbow height on your left / right side. 3. Place a soft object, such as a folded towel or a small pillow, between your left / right upper arm and your body to move your elbow a few inches away (about 10 cm) from your side. 4. Hold the end of the band so it is tight and there is no slack. 5. Keeping your elbow pressed against the soft object, move your left / right forearm out, away from your abdomen. Keep your body steady so only your forearm moves. 6. Hold for __________ seconds. 7. Slowly return to the starting position. Repeat __________ times. Complete this exercise __________ times a day. Exercise H:Shoulder Abduction  1. Sit in a stable chair  without armrests, or stand. 2. Hold a __________ weight in your left / right hand, or hold an exercise band with both hands. 3. Start with your arms straight down and your left / right palm facing in, toward your body. 4. Slowly lift your left / right hand out to your side. Do not lift your hand above shoulder height unless your health care provider tells you that this is safe. ? Keep your arms straight. ? Avoid shrugging your shoulder while you do this movement. Keep your shoulder blade tucked down toward the middle of your back. 5. Hold for __________ seconds. 6. Slowly lower your arm, and return to the starting position. Repeat __________ times. Complete this exercise __________ times a day. Exercise I:Shoulder Extension 1. Sit in a stable chair without armrests, or stand. 2. Secure an exercise band to a stable object in front of you where it is at shoulder height. 3. Hold one end of the exercise band in each hand. Your palms should face each other. 4. Straighten your elbows and lift your hands up to shoulder height. 5. Step back, away from the secured end of the exercise band, until the band is tight and there is no slack. 6. Squeeze your shoulder blades together as you pull your hands down to the sides of your thighs. Stop when your hands are straight down by your sides. Do not let your hands go behind your body. 7. Hold for __________ seconds. 8. Slowly return to the starting position. Repeat __________ times. Complete this exercise __________ times a day. Exercise J:Standing Shoulder Row 1. Sit in a stable chair without armrests, or stand. 2. Secure an exercise band to a stable object in front of you so it is at waist height. 3. Hold one end of the exercise band in each hand. Your palms should be in a thumbs-up position. 4. Bend each of your elbows to an "L" shape (about 90 degrees) and keep your upper arms at your sides. 5. Step back until the band is tight and there is no  slack. 6. Slowly pull your elbows back behind you. 7. Hold for __________ seconds. 8. Slowly return to the starting position. Repeat __________ times. Complete this exercise __________ times a day. Exercise K:Shoulder Press-Ups  1. Sit in a stable chair that has armrests. Sit upright, with your feet flat on the floor. 2. Put your hands on the armrests so your elbows are bent and your fingers are pointing forward. Your hands should be about even with the sides of your body. 3. Push down on  the armrests and use your arms to lift yourself off of the chair. Straighten your elbows and lift yourself up as much as you comfortably can. ? Move your shoulder blades down, and avoid letting your shoulders move up toward your ears. ? Keep your feet on the ground. As you get stronger, your feet should support less of your body weight as you lift yourself up. 4. Hold for __________ seconds. 5. Slowly lower yourself back into the chair. Repeat __________ times. Complete this exercise __________ times a day. Exercise L: Wall Push-Ups  1. Stand so you are facing a stable wall. Your feet should be about one arm-length away from the wall. 2. Lean forward and place your palms on the wall at shoulder height. 3. Keep your feet flat on the floor as you bend your elbows and lean forward toward the wall. 4. Hold for __________ seconds. 5. Straighten your elbows to push yourself back to the starting position. Repeat __________ times. Complete this exercise __________ times a day. This information is not intended to replace advice given to you by your health care provider. Make sure you discuss any questions you have with your health care provider. Document Released: 10/13/2005 Document Revised: 08/23/2016 Document Reviewed: 08/10/2015 Elsevier Interactive Patient Education  2018 [employer].

## 2018-08-24 ENCOUNTER — Telehealth: Payer: Self-pay

## 2018-08-24 ENCOUNTER — Encounter: Payer: Self-pay | Admitting: Family Medicine

## 2018-08-24 LAB — LIPID PANEL
Chol/HDL Ratio: 2.7 ratio (ref 0.0–5.0)
Cholesterol, Total: 175 mg/dL (ref 100–199)
HDL: 66 mg/dL (ref >39)
LDL Calculated: 94 mg/dL (ref 0–99)
Triglycerides: 75 mg/dL (ref 0–149)
VLDL Cholesterol Cal: 15 mg/dL (ref 5–40)

## 2018-08-24 LAB — COMPREHENSIVE METABOLIC PANEL
ALT: 29 IU/L (ref 0–44)
AST: 18 IU/L (ref 0–40)
Albumin/Globulin Ratio: 1.9 (ref 1.2–2.2)
Albumin: 4.1 g/dL (ref 3.5–5.5)
Alkaline Phosphatase: 41 IU/L (ref 39–117)
BUN/Creatinine Ratio: 13 (ref 9–20)
BUN: 13 mg/dL (ref 6–24)
Bilirubin Total: 0.5 mg/dL (ref 0.0–1.2)
CO2: 26 mmol/L (ref 20–29)
Calcium: 9.3 mg/dL (ref 8.7–10.2)
Chloride: 99 mmol/L (ref 96–106)
Creatinine, Ser: 0.97 mg/dL (ref 0.76–1.27)
GFR calc Af Amer: 101 mL/min/{1.73_m2} (ref >59)
GFR calc non Af Amer: 88 mL/min/{1.73_m2} (ref >59)
Globulin, Total: 2.2 g/dL (ref 1.5–4.5)
Glucose: 90 mg/dL (ref 65–99)
Potassium: 4.8 mmol/L (ref 3.5–5.2)
Sodium: 139 mmol/L (ref 134–144)
Total Protein: 6.3 g/dL (ref 6.0–8.5)

## 2018-08-24 LAB — HEMOGLOBIN A1C
Est. average glucose Bld gHb Est-mCnc: 111 mg/dL
Hgb A1c MFr Bld: 5.5 % (ref 4.8–5.6)

## 2018-08-24 NOTE — Telephone Encounter (Signed)
Lab letter sent via mail to pt home address. Dgaddy, CMA 

## 2018-11-30 ENCOUNTER — Telehealth: Payer: Self-pay | Admitting: Family Medicine

## 2018-11-30 NOTE — Telephone Encounter (Signed)
Called and spoke with patient regarding appt with Dr. Nolon Rod on 02/21/19. Due to provider schedule change, I was able to get pt rescheduled for CPE on 02/26/19 at 11:00. I advised of time, building number, late policy and fasting. Pt acknowledged.

## 2019-01-09 ENCOUNTER — Telehealth: Payer: Self-pay | Admitting: Family Medicine

## 2019-01-09 NOTE — Telephone Encounter (Signed)
Called and spoke with pt regarding their appt scheduled with Dr. Nolon Rod on 02/26/19. Due to Dr. Nolon Rod being out of the office, pt was needing to be rescheduled. I was able to get pt rescheduled to 02/26/19 at 11:00 with Dr. Pamella Pert due to pt being cancelled twice by Dr. Nolon Rod for his CPE. Pt stated that he HAD to see someone THAT day at THAT time due to work. He has to ask off in a certain amount of time. I advised of time, building number and late policy. Pt acknowledged.

## 2019-02-21 ENCOUNTER — Encounter: Payer: BLUE CROSS/BLUE SHIELD | Admitting: Family Medicine

## 2019-02-26 ENCOUNTER — Ambulatory Visit (INDEPENDENT_AMBULATORY_CARE_PROVIDER_SITE_OTHER): Payer: BLUE CROSS/BLUE SHIELD | Admitting: Family Medicine

## 2019-02-26 ENCOUNTER — Encounter: Payer: Self-pay | Admitting: Family Medicine

## 2019-02-26 ENCOUNTER — Encounter: Payer: BLUE CROSS/BLUE SHIELD | Admitting: Family Medicine

## 2019-02-26 ENCOUNTER — Other Ambulatory Visit: Payer: Self-pay

## 2019-02-26 VITALS — BP 130/84 | HR 72 | Temp 98.9°F | Resp 17 | Ht 71.5 in | Wt 258.0 lb

## 2019-02-26 DIAGNOSIS — H6123 Impacted cerumen, bilateral: Secondary | ICD-10-CM

## 2019-02-26 DIAGNOSIS — Z125 Encounter for screening for malignant neoplasm of prostate: Secondary | ICD-10-CM

## 2019-02-26 DIAGNOSIS — Z0001 Encounter for general adult medical examination with abnormal findings: Secondary | ICD-10-CM

## 2019-02-26 DIAGNOSIS — Z0000 Encounter for general adult medical examination without abnormal findings: Secondary | ICD-10-CM

## 2019-02-26 DIAGNOSIS — E782 Mixed hyperlipidemia: Secondary | ICD-10-CM | POA: Diagnosis not present

## 2019-02-26 DIAGNOSIS — I10 Essential (primary) hypertension: Secondary | ICD-10-CM

## 2019-02-26 DIAGNOSIS — Z1211 Encounter for screening for malignant neoplasm of colon: Secondary | ICD-10-CM

## 2019-02-26 MED ORDER — LISINOPRIL 40 MG PO TABS: 40.0000 mg | ORAL_TABLET | Freq: Every day | ORAL | 1 refills | Status: DC

## 2019-02-26 MED ORDER — HYDROCHLOROTHIAZIDE 25 MG PO TABS: 25.0000 mg | ORAL_TABLET | Freq: Every day | ORAL | 1 refills | Status: DC

## 2019-02-26 NOTE — Patient Instructions (Signed)
° ° ° °  If you have lab work done today you will be contacted with your lab results within the next 2 weeks.  If you have not heard from us then please contact us. The fastest way to get your results is to register for My Chart. ° ° °IF you received an x-ray today, you will receive an invoice from [facility]. Please contact [facility] at [phone] with questions or concerns regarding your invoice.  ° °IF you received labwork today, you will receive an invoice from [facility]. Please contact [facility] at [phone] with questions or concerns regarding your invoice.  ° °Our billing staff will not be able to assist you with questions regarding bills from these companies. ° °You will be contacted with the lab results as soon as they are available. The fastest way to get your results is to activate your My Chart account. Instructions are located on the last page of this paperwork. If you have not heard from us regarding the results in 2 weeks, please contact this office. °  ° ° ° °

## 2019-02-26 NOTE — Progress Notes (Addendum)
3/16/202011:53 AM  Jonathon Fischer 07-Sep-1961, 58 y.o. male [id number]  Chief Complaint  Patient presents with  . Annual Exam    cpe  . Medication Refill    hctz, lisinopril adn sildenafil    HPI:   Patient is a 58 y.o. male with past medical history significant for HTN and HLP who presents today for CPE  Last CPE 2016 Colorectal Cancer Screening: has never had, no fhx Prostate Cancer Screening: today HCV Screening: 2018 STI Screening: 2016 Seasonal Influenza Vaccination: declines Td/Tdap Vaccination: 2019 Pneumococcal Vaccination: at age 89 Zoster Vaccination: will order today Frequency of Dental evaluation: Q6 months Frequency of Eye evaluation: has not seen in a while, no concerns   Visual Acuity Screening   Right eye Left eye Both eyes  Without correction: 20/20 20/20 20/20   With correction:       Fall Risk  02/26/2019 08/23/2018 02/13/2018 02/13/2018 08/10/2017  Falls in the past year? 0 No No No No  Number falls in past yr: 0 - - - -  Injury with Fall? 0 - - - -  Follow up Falls evaluation completed - - - -     Depression screen [facility] 2/9 02/26/2019 08/23/2018 02/13/2018  Decreased Interest 0 0 0  Down, Depressed, Hopeless 0 0 0  PHQ - 2 Score 0 0 0    No Known Allergies  Prior to Admission medications   Medication Sig Start Date End Date Taking? Authorizing Provider  hydrochlorothiazide (HYDRODIURIL) 25 MG tablet TAKE 1 TABLET BY MOUTH ONCE DAILY 08/16/18  Yes Stallings, Zoe A, MD  lisinopril (PRINIVIL,ZESTRIL) 40 MG tablet Take 1 tablet (40 mg total) by mouth daily. 08/23/18  Yes Forrest Moron, MD  sildenafil (VIAGRA) 50 MG tablet Take 1 tablet (50 mg total) by mouth daily as needed for erectile dysfunction. 02/13/18  Yes Forrest Moron, MD    Past Medical History:  Diagnosis Date  . Hypertension     Past Surgical History:  Procedure Laterality Date  . FEMUR FRACTURE SURGERY     right    Social History   Tobacco Use  . Smoking status: Former Smoker     Last attempt to quit: 09/07/2002    Years since quitting: 16.4  . Smokeless tobacco: Never Used  Substance Use Topics  . Alcohol use: Yes    Alcohol/week: 12.0 standard drinks    Types: 12 Standard drinks or equivalent per week    Family History  Problem Relation Age of Onset  . Cancer Maternal Grandfather        leukemia    Review of Systems  Constitutional: Negative for chills and fever.  Respiratory: Negative for cough and shortness of breath.   Cardiovascular: Negative for chest pain, palpitations and leg swelling.  Gastrointestinal: Negative for abdominal pain, nausea and vomiting.  All other systems reviewed and are negative.    OBJECTIVE:  Blood pressure 130/84, pulse 72, temperature 98.9 F (37.2 C), temperature source Oral, resp. rate 17, height 5' 11.5" (1.816 m), weight 258 lb (117 kg), SpO2 97 %. Body mass index is 35.48 kg/m.   Physical Exam Vitals signs and nursing note reviewed.  Constitutional:      Appearance: He is well-developed.  HENT:     Head: Normocephalic and atraumatic.     Right Ear: Hearing, ear canal and external ear normal. There is impacted cerumen. 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     Left Ear: Hearing,  tympanic membrane, ear canal and external ear normal. There is impacted cerumen.     Mouth/Throat:     Pharynx: No oropharyngeal exudate.  Eyes:     Conjunctiva/sclera: Conjunctivae normal.     Pupils: Pupils are equal, round, and reactive to light.  Neck:     Musculoskeletal: Neck supple.     Thyroid: No thyromegaly.  Cardiovascular:     Rate and Rhythm: Normal rate and regular rhythm.     Heart sounds: Normal heart sounds. No murmur. No friction rub. No gallop.   Pulmonary:     Effort: Pulmonary effort is normal.     Breath sounds: Normal breath sounds. No wheezing, rhonchi or rales.  Abdominal:     General: Bowel sounds are normal. There is no distension.     Palpations: Abdomen  is soft. There is no mass.     Tenderness: There is no abdominal tenderness.  Musculoskeletal: Normal range of motion.  Lymphadenopathy:     Cervical: No cervical adenopathy.  Skin:    General: Skin is warm and dry.  Neurological:     Mental Status: He is alert and oriented to person, place, and time.     Cranial Nerves: No cranial nerve deficit.     Coordination: Coordination normal.     Gait: Gait normal.     Deep Tendon Reflexes: Reflexes are normal and symmetric.  Psychiatric:        Mood and Affect: Mood normal.        Behavior: Behavior normal.     ASSESSMENT and PLAN  1. Annual physical exam Routine HCM labs ordered. HCM reviewed/discussed. Anticipatory guidance regarding healthy weight, lifestyle and choices given.   2. Colon cancer screening - Cologuard  3. Prostate cancer screening - PSA  4. Essential hypertension Controlled. Continue current regime.  - TSH - CMP14+EGFR - CBC  5. Mixed hyperlipidemia - Lipid panel  6. Bilateral impacted cerumen Ears lavaged by CMA successfully - Ear wax removal  Other orders - hydrochlorothiazide (HYDRODIURIL) 25 MG tablet; Take 1 tablet (25 mg total) by mouth daily. - lisinopril (PRINIVIL,ZESTRIL) 40 MG tablet; Take 1 tablet (40 mg total) by mouth daily.  Return in about 6 months (around 08/29/2019) for HTN.    Rutherford Guys, MD [facility] [place] [place], [place] [address] Ph.  [phone] Fax [phone]

## 2019-02-27 LAB — CMP14+EGFR
ALT: 44 IU/L (ref 0–44)
AST: 28 IU/L (ref 0–40)
Albumin/Globulin Ratio: 2.1 (ref 1.2–2.2)
Albumin: 4.6 g/dL (ref 3.8–4.9)
Alkaline Phosphatase: 42 IU/L (ref 39–117)
BUN/Creatinine Ratio: 15 (ref 9–20)
BUN: 15 mg/dL (ref 6–24)
Bilirubin Total: 0.5 mg/dL (ref 0.0–1.2)
CO2: 22 mmol/L (ref 20–29)
Calcium: 9.5 mg/dL (ref 8.7–10.2)
Chloride: 100 mmol/L (ref 96–106)
Creatinine, Ser: 1.01 mg/dL (ref 0.76–1.27)
GFR calc Af Amer: 96 mL/min/{1.73_m2} (ref >59)
GFR calc non Af Amer: 83 mL/min/{1.73_m2} (ref >59)
Globulin, Total: 2.2 g/dL (ref 1.5–4.5)
Glucose: 97 mg/dL (ref 65–99)
Potassium: 4.8 mmol/L (ref 3.5–5.2)
Sodium: 139 mmol/L (ref 134–144)
Total Protein: 6.8 g/dL (ref 6.0–8.5)

## 2019-02-27 LAB — CBC
Hematocrit: 51.1 % — ABNORMAL HIGH (ref 37.5–51.0)
Hemoglobin: 17.1 g/dL (ref 13.0–17.7)
MCH: 30.3 pg (ref 26.6–33.0)
MCHC: 33.5 g/dL (ref 31.5–35.7)
MCV: 90 fL (ref 79–97)
Platelets: 267 10*3/uL (ref 150–450)
RBC: 5.65 x10E6/uL (ref 4.14–5.80)
RDW: 12.9 % (ref 11.6–15.4)
WBC: 7.4 10*3/uL (ref 3.4–10.8)

## 2019-02-27 LAB — LIPID PANEL
Chol/HDL Ratio: 3.2 ratio (ref 0.0–5.0)
Cholesterol, Total: 186 mg/dL (ref 100–199)
HDL: 58 mg/dL (ref >39)
LDL Calculated: 112 mg/dL — ABNORMAL HIGH (ref 0–99)
Triglycerides: 79 mg/dL (ref 0–149)
VLDL Cholesterol Cal: 16 mg/dL (ref 5–40)

## 2019-02-27 LAB — PSA: Prostate Specific Ag, Serum: 1.5 ng/mL (ref 0.0–4.0)

## 2019-02-27 LAB — TSH: TSH: 0.5 u[IU]/mL (ref 0.450–4.500)

## 2019-07-01 IMAGING — DX DG HIP (WITH OR WITHOUT PELVIS) 2-3V*R*
3 series · 3 of 3 positions shown · non-contrast
Comparison: 05/29/2014

CLINICAL DATA: Right hip pain, no known injury, initial encounter

EXAM:
DG HIP (WITH OR WITHOUT PELVIS) 3V RIGHT

[pelvis ap]
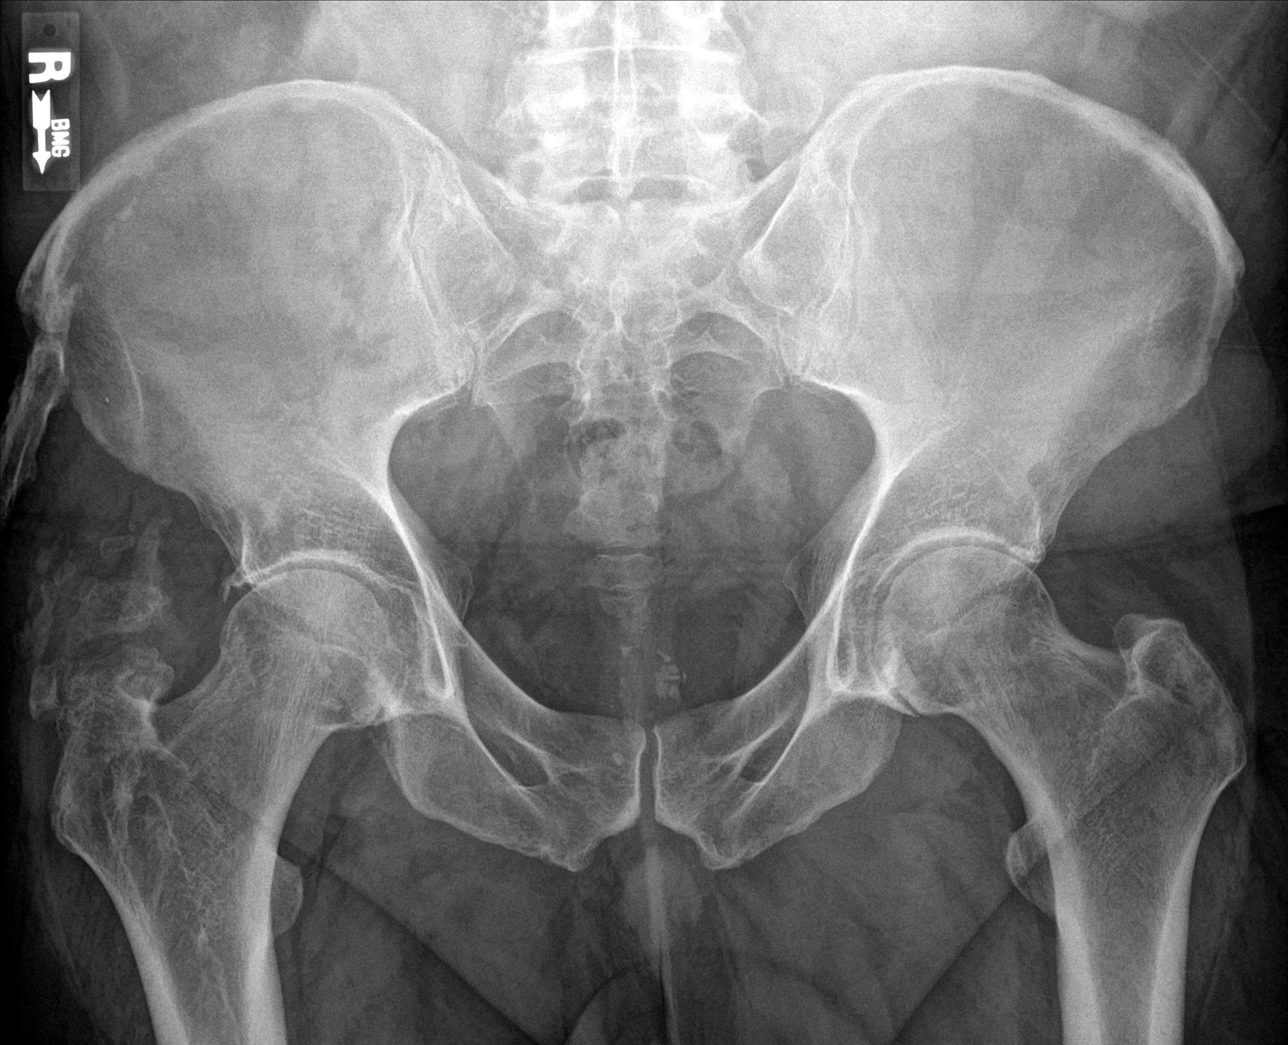

[hip ap]
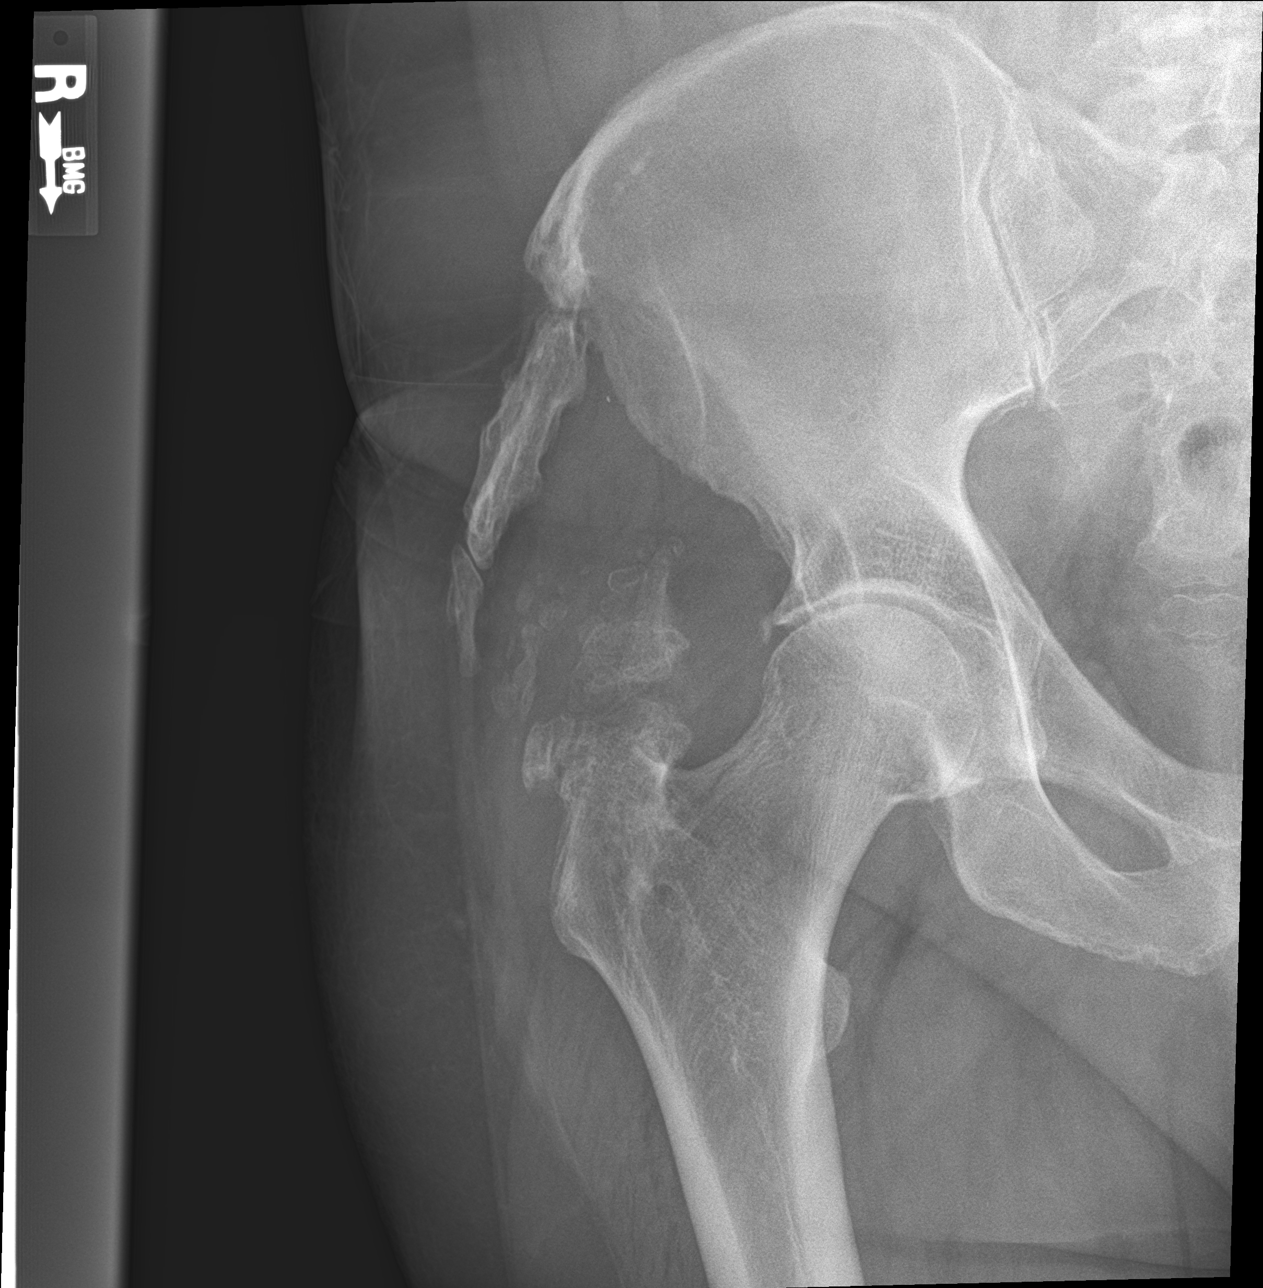

[hip lat]
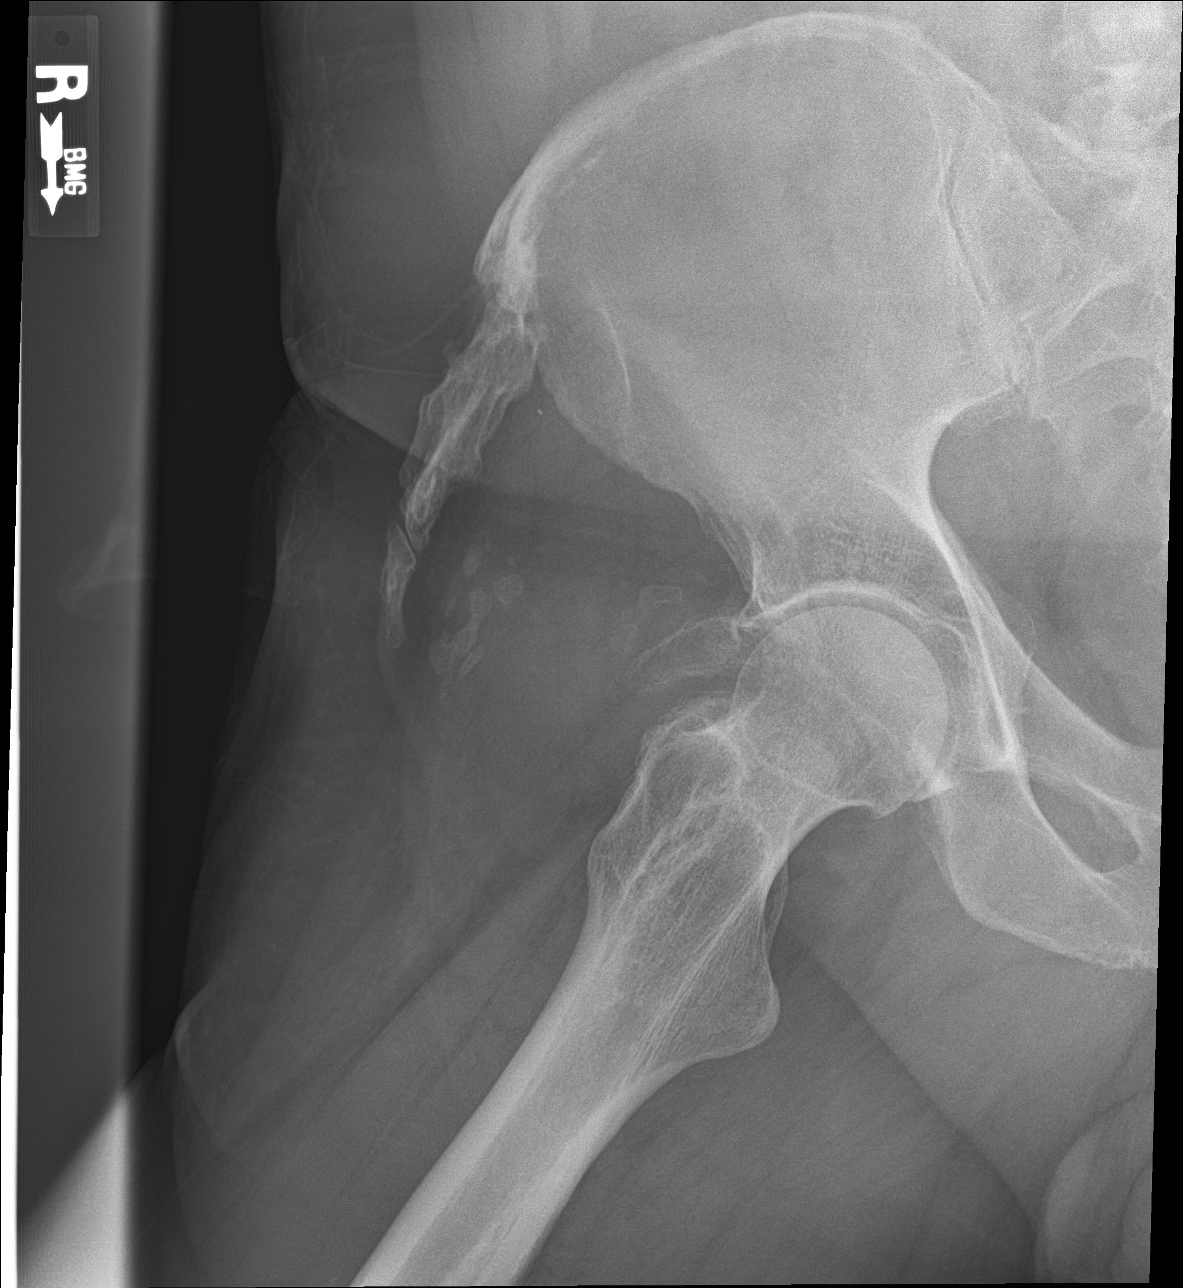

[3 of 3 positions shown; findings below may reference images not displayed]

FINDINGS: Changes of prior medullary rod placement are seen as well as some
dystrophic calcification arising adjacent to the greater trochanter
and right iliac wing laterally. These changes are stable from the
prior exam. No acute fracture or dislocation is noted. The pelvic
ring is intact. Prostatic calcifications are noted.
IMPRESSION: Chronic postsurgical and degenerative changes about the right hip
and right pelvis stable from the previous exam. No acute abnormality
is noted.

## 2019-08-29 ENCOUNTER — Other Ambulatory Visit: Payer: Self-pay

## 2019-08-29 ENCOUNTER — Ambulatory Visit (INDEPENDENT_AMBULATORY_CARE_PROVIDER_SITE_OTHER): Payer: Self-pay | Admitting: Family Medicine

## 2019-08-29 ENCOUNTER — Encounter: Payer: Self-pay | Admitting: Family Medicine

## 2019-08-29 VITALS — BP 132/84 | HR 76 | Temp 98.8°F | Resp 18 | Ht 71.5 in | Wt 265.8 lb

## 2019-08-29 DIAGNOSIS — R946 Abnormal results of thyroid function studies: Secondary | ICD-10-CM

## 2019-08-29 DIAGNOSIS — I10 Essential (primary) hypertension: Secondary | ICD-10-CM

## 2019-08-29 MED ORDER — LISINOPRIL 40 MG PO TABS: 40.0000 mg | ORAL_TABLET | Freq: Every day | ORAL | 1 refills | Status: DC

## 2019-08-29 MED ORDER — HYDROCHLOROTHIAZIDE 25 MG PO TABS: 25.0000 mg | ORAL_TABLET | Freq: Every day | ORAL | 1 refills | Status: DC

## 2019-08-29 NOTE — Progress Notes (Signed)
Established Patient Office Visit  Subjective:  Patient ID: Jonathon Fischer, male    DOB: 1961/07/13  Age: 58 y.o. MRN: [id number]  CC:  Chief Complaint  Patient presents with  . Hypertension    6 month f/u    HPI Zadrian R Tones presents for   Hypertension: Patient here for follow-up of elevated blood pressure. He is not exercising and is adherent to low salt diet.  Blood pressure is well controlled at home. Cardiac symptoms none. Patient denies chest pressure/discomfort, exertional chest pressure/discomfort, irregular heart beat, near-syncope and palpitations.  Cardiovascular risk factors: hypertension. Use of agents associated with hypertension: none. History of target organ damage: none. BP Readings from Last 3 Encounters:  08/29/19 132/84  02/26/19 130/84  08/23/18 134/88   Pt  Had a borderline tsh No palpitations No history of thyroid disease in the family No fatigue Lab Results  Component Value Date   TSH 0.500 02/26/2019   Depression screen [facility] 2/9 08/29/2019 02/26/2019 08/23/2018 02/13/2018 02/13/2018  Decreased Interest 0 0 0 0 0  Down, Depressed, Hopeless 0 0 0 0 0  PHQ - 2 Score 0 0 0 0 0       Past Medical History:  Diagnosis Date  . Hypertension     Past Surgical History:  Procedure Laterality Date  . FEMUR FRACTURE SURGERY     right    Family History  Problem Relation Age of Onset  . Cancer Maternal Grandfather        leukemia    Social History   Socioeconomic History  . Marital status: Single    Spouse name: Not on file  . Number of children: Not on file  . Years of education: Not on file  . Highest education level: Not on file  Occupational History  . Not on file  Social Needs  . Financial resource strain: Not on file  . Food insecurity    Worry: Not on file    Inability: Not on file  . Transportation needs    Medical: Not on file    Non-medical: Not on file  Tobacco Use  . Smoking status: Former Smoker    Quit date: 09/07/2002   Years since quitting: 16.9  . Smokeless tobacco: Never Used  Substance and Sexual Activity  . Alcohol use: Yes    Alcohol/week: 12.0 standard drinks    Types: 12 Standard drinks or equivalent per week  . Drug use: No  . Sexual activity: Not on file  Lifestyle  . Physical activity    Days per week: Not on file    Minutes per session: Not on file  . Stress: Not on file  Relationships  . Social Herbalist on phone: Not on file    Gets together: Not on file    Attends religious service: Not on file    Active member of club or organization: Not on file    Attends meetings of clubs or organizations: Not on file    Relationship status: Not on file  . Intimate partner violence    Fear of current or ex partner: Not on file    Emotionally abused: Not on file    Physically abused: Not on file    Forced sexual activity: Not on file  Other Topics Concern  . Not on file  Social History Narrative  . Not on file    Outpatient Medications Prior to Visit  Medication Sig Dispense Refill  . hydrochlorothiazide (HYDRODIURIL) 25  MG tablet Take 1 tablet (25 mg total) by mouth daily. 90 tablet 1  . lisinopril (PRINIVIL,ZESTRIL) 40 MG tablet Take 1 tablet (40 mg total) by mouth daily. 90 tablet 1   No facility-administered medications prior to visit.     No Known Allergies  ROS Review of Systems Review of Systems  Constitutional: Negative for activity change, appetite change, chills and fever.  HENT: Negative for congestion, nosebleeds, trouble swallowing and voice change.   Respiratory: Negative for cough, shortness of breath and wheezing.   Gastrointestinal: Negative for diarrhea, nausea and vomiting.  Genitourinary: Negative for difficulty urinating, dysuria, flank pain and hematuria.  Musculoskeletal: Negative for back pain, joint swelling and neck pain.  Neurological: Negative for dizziness, speech difficulty, light-headedness and numbness.  See HPI. All other review of  systems negative.     Objective:    Physical Exam  BP 132/84   Pulse 76   Temp 98.8 F (37.1 C) (Oral)   Resp 18   Ht 5' 11.5" (1.816 m)   Wt 265 lb 12.8 oz (120.6 kg)   SpO2 99%   BMI 36.55 kg/m  Wt Readings from Last 3 Encounters:  08/29/19 265 lb 12.8 oz (120.6 kg)  02/26/19 258 lb (117 kg)  08/23/18 261 lb 9.6 oz (118.7 kg)   Physical Exam  Constitutional: Oriented to person, place, and time. Appears well-developed and well-nourished.  HENT:  Head: Normocephalic and atraumatic.  Eyes: Conjunctivae and EOM are normal.  Cardiovascular: Normal rate, regular rhythm, normal heart sounds and intact distal pulses.  No murmur heard. Pulmonary/Chest: Effort normal and breath sounds normal. No stridor. No respiratory distress. Has no wheezes.  Neurological: Is alert and oriented to person, place, and time.  Skin: Skin is warm. Capillary refill takes less than 2 seconds.  Psychiatric: Has a normal mood and affect. Behavior is normal. Judgment and thought content normal.    Health Maintenance Due  Topic Date Due  . INFLUENZA VACCINE  07/14/2019    There are no preventive care reminders to display for this patient.  Lab Results  Component Value Date   TSH 0.500 02/26/2019   Lab Results  Component Value Date   WBC 7.4 02/26/2019   HGB 17.1 02/26/2019   HCT 51.1 (H) 02/26/2019   MCV 90 02/26/2019   PLT 267 02/26/2019   Lab Results  Component Value Date   NA 139 02/26/2019   K 4.8 02/26/2019   CO2 22 02/26/2019   GLUCOSE 97 02/26/2019   BUN 15 02/26/2019   CREATININE 1.01 02/26/2019   BILITOT 0.5 02/26/2019   ALKPHOS 42 02/26/2019   AST 28 02/26/2019   ALT 44 02/26/2019   PROT 6.8 02/26/2019   ALBUMIN 4.6 02/26/2019   CALCIUM 9.5 02/26/2019   Lab Results  Component Value Date   CHOL 186 02/26/2019   Lab Results  Component Value Date   HDL 58 02/26/2019   Lab Results  Component Value Date   LDLCALC 112 (H) 02/26/2019   Lab Results  Component Value  Date   TRIG 79 02/26/2019   Lab Results  Component Value Date   CHOLHDL 3.2 02/26/2019   Lab Results  Component Value Date   HGBA1C 5.5 08/23/2018      Assessment & Plan:   Problem List Items Addressed This Visit      Cardiovascular and Mediastinum   Essential hypertension - Primary Patient's blood pressure is at goal of 139/89 or less. Condition is stable. Continue current medications and  treatment plan. I recommend that you exercise for 30-45 minutes 5 days a week. I also recommend a balanced diet with fruits and vegetables every day, lean meats, and little fried foods. The DASH diet (you can find this online) is a good example of this.    Relevant Orders   Basic metabolic panel    Other Visit Diagnoses    Borderline abnormal thyroid function test       Relevant Orders   TSH      No orders of the defined types were placed in this encounter.   Follow-up: No follow-ups on file.    Forrest Moron, MD

## 2019-08-30 LAB — BASIC METABOLIC PANEL
BUN/Creatinine Ratio: 18 (ref 9–20)
BUN: 16 mg/dL (ref 6–24)
CO2: 21 mmol/L (ref 20–29)
Calcium: 9.6 mg/dL (ref 8.7–10.2)
Chloride: 99 mmol/L (ref 96–106)
Creatinine, Ser: 0.91 mg/dL (ref 0.76–1.27)
GFR calc Af Amer: 109 mL/min/{1.73_m2} (ref >59)
GFR calc non Af Amer: 94 mL/min/{1.73_m2} (ref >59)
Glucose: 105 mg/dL — ABNORMAL HIGH (ref 65–99)
Potassium: 4.5 mmol/L (ref 3.5–5.2)
Sodium: 137 mmol/L (ref 134–144)

## 2019-08-30 LAB — TSH: TSH: 0.675 u[IU]/mL (ref 0.450–4.500)

## 2019-08-31 ENCOUNTER — Encounter: Payer: Self-pay | Admitting: Family Medicine

## 2020-02-29 ENCOUNTER — Encounter: Payer: Self-pay | Admitting: Family Medicine

## 2020-03-05 ENCOUNTER — Other Ambulatory Visit: Payer: Self-pay

## 2020-03-05 ENCOUNTER — Encounter: Payer: Self-pay | Admitting: Family Medicine

## 2020-03-05 ENCOUNTER — Ambulatory Visit (INDEPENDENT_AMBULATORY_CARE_PROVIDER_SITE_OTHER): Payer: Self-pay | Admitting: Family Medicine

## 2020-03-05 VITALS — BP 134/76 | HR 72 | Temp 97.5°F | Ht 71.5 in | Wt 266.0 lb

## 2020-03-05 DIAGNOSIS — Z0001 Encounter for general adult medical examination with abnormal findings: Secondary | ICD-10-CM

## 2020-03-05 DIAGNOSIS — I10 Essential (primary) hypertension: Secondary | ICD-10-CM

## 2020-03-05 DIAGNOSIS — H6122 Impacted cerumen, left ear: Secondary | ICD-10-CM

## 2020-03-05 DIAGNOSIS — Z0000 Encounter for general adult medical examination without abnormal findings: Secondary | ICD-10-CM

## 2020-03-05 DIAGNOSIS — Z131 Encounter for screening for diabetes mellitus: Secondary | ICD-10-CM

## 2020-03-05 MED ORDER — HYDROCHLOROTHIAZIDE 25 MG PO TABS: 25.0000 mg | ORAL_TABLET | Freq: Every day | ORAL | 1 refills | Status: DC

## 2020-03-05 MED ORDER — LISINOPRIL 40 MG PO TABS: 40.0000 mg | ORAL_TABLET | Freq: Every day | ORAL | 1 refills | Status: DC

## 2020-03-05 NOTE — Patient Instructions (Addendum)
Call [employer] to see if you can send in prior kit. Also update them with new insurance when available.   I do recommend the shingrix vaccine if you have not had it yet - that is given at your pharmacy.     COVID-19 Vaccine Information can be found at: ShippingScam.co.uk For questions related to vaccine distribution or appointments, please email [email or web address] or call [phone].  Here is a link to the CDC, which provides information on individuals who may be at increased risk of severe illness with Covid: http://huff.org/  Keeping you healthy  Get these tests  Blood pressure- Have your blood pressure checked once a year by your healthcare provider.  Normal blood pressure is 120/80  Weight- Have your body mass index (BMI) calculated to screen for obesity.  BMI is a measure of body fat based on height and weight. You can also calculate your own BMI at ViewBanking.si.  Cholesterol- Have your cholesterol checked every year.  Diabetes- Have your blood sugar checked regularly if you have high blood pressure, high cholesterol, have a family history of diabetes or if you are overweight.  Screening for Colon Cancer- Colonoscopy starting at age 5.  Screening may begin sooner depending on your family history and other health conditions. Follow up colonoscopy as directed by your Gastroenterologist.  Screening for Prostate Cancer- Both blood work (PSA) and a rectal exam help screen for Prostate Cancer.  Screening begins at age 27 with African-American men and at age 37 with Caucasian men.  Screening may begin sooner depending on your family history.  Take these medicines  Aspirin- One aspirin daily can help prevent Heart disease and Stroke.  Flu shot- Every fall.  Tetanus- Every 10 years.  Zostavax- Once after the age of 35 to prevent  Shingles.  Pneumonia shot- Once after the age of 74; if you are younger than 90, ask your healthcare provider if you need a Pneumonia shot.  Take these steps  Don't smoke- If you do smoke, talk to your doctor about quitting.  For tips on how to quit, go to www.smokefree.gov or call 1-800-QUIT-NOW.  Be physically active- Exercise 5 days a week for at least 30 minutes.  If you are not already physically active start slow and gradually work up to 30 minutes of moderate physical activity.  Examples of moderate activity include walking briskly, mowing the yard, dancing, swimming, bicycling, etc.  Eat a healthy diet- Eat a variety of healthy food such as fruits, vegetables, low fat milk, low fat cheese, yogurt, lean meant, poultry, fish, beans, tofu, etc. For more information go to www.thenutritionsource.org  Drink alcohol in moderation- Limit alcohol intake to less than two drinks a day. Never drink and drive.  Dentist- Brush and floss twice daily; visit your dentist twice a year.  Depression- Your emotional health is as important as your physical health. If you're feeling down, or losing interest in things you would normally enjoy please talk to your healthcare provider.  Eye exam- Visit your eye doctor every year.  Safe sex- If you may be exposed to a sexually transmitted infection, use a condom.  Seat belts- Seat belts can save your life; always wear one.  Smoke/Carbon Monoxide detectors- These detectors need to be installed on the appropriate level of your home.  Replace batteries at least once a year.  Skin cancer- When out in the sun, cover up and use sunscreen 15 SPF or higher.  Violence- If anyone is threatening you, please tell your healthcare provider.  Living Will/ Health care power of attorney- Speak with your healthcare provider and family.  If you have lab work done today you will be contacted with your lab results within the next 2 weeks.  If you have not heard from [place] then  please contact us. The fastest way to get your results is to register for My Chart.   IF you received an x-ray today, you will receive an invoice from [facility]. Please contact [facility] at [phone] with questions or concerns regarding your invoice.   IF you received labwork today, you will receive an invoice from [facility]. Please contact [facility] at [phone] with questions or concerns regarding your invoice.   Our billing staff will not be able to assist you with questions regarding bills from these companies.  You will be contacted with the lab results as soon as they are available. The fastest way to get your results is to activate your My Chart account. Instructions are located on the last page of this paperwork. If you have not heard from [place] regarding the results in 2 weeks, please contact this office.
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
## 2020-03-06 LAB — LIPID PANEL
Chol/HDL Ratio: 3.1 ratio (ref 0.0–5.0)
Cholesterol, Total: 183 mg/dL (ref 100–199)
HDL: 59 mg/dL (ref >39)
LDL Chol Calc (NIH): 105 mg/dL — ABNORMAL HIGH (ref 0–99)
Triglycerides: 108 mg/dL (ref 0–149)
VLDL Cholesterol Cal: 19 mg/dL (ref 5–40)

## 2020-03-06 LAB — COMPREHENSIVE METABOLIC PANEL
ALT: 40 IU/L (ref 0–44)
AST: 28 IU/L (ref 0–40)
Albumin/Globulin Ratio: 2.2 (ref 1.2–2.2)
Albumin: 4.6 g/dL (ref 3.8–4.9)
Alkaline Phosphatase: 41 IU/L (ref 39–117)
BUN/Creatinine Ratio: 17 (ref 9–20)
BUN: 17 mg/dL (ref 6–24)
Bilirubin Total: 0.5 mg/dL (ref 0.0–1.2)
CO2: 24 mmol/L (ref 20–29)
Calcium: 9.7 mg/dL (ref 8.7–10.2)
Chloride: 99 mmol/L (ref 96–106)
Creatinine, Ser: 0.98 mg/dL (ref 0.76–1.27)
GFR calc Af Amer: 99 mL/min/{1.73_m2} (ref >59)
GFR calc non Af Amer: 85 mL/min/{1.73_m2} (ref >59)
Globulin, Total: 2.1 g/dL (ref 1.5–4.5)
Glucose: 103 mg/dL — ABNORMAL HIGH (ref 65–99)
Potassium: 4.9 mmol/L (ref 3.5–5.2)
Sodium: 137 mmol/L (ref 134–144)
Total Protein: 6.7 g/dL (ref 6.0–8.5)

## 2020-03-06 LAB — HEMOGLOBIN A1C
Est. average glucose Bld gHb Est-mCnc: 114 mg/dL
Hgb A1c MFr Bld: 5.6 % (ref 4.8–5.6)

## 2020-03-17 ENCOUNTER — Encounter: Payer: Self-pay | Admitting: Radiology

## 2020-09-03 ENCOUNTER — Encounter: Payer: Self-pay | Admitting: Family Medicine

## 2020-09-03 ENCOUNTER — Ambulatory Visit (INDEPENDENT_AMBULATORY_CARE_PROVIDER_SITE_OTHER): Payer: Self-pay | Admitting: Family Medicine

## 2020-09-03 ENCOUNTER — Other Ambulatory Visit: Payer: Self-pay

## 2020-09-03 VITALS — BP 136/84 | HR 67 | Temp 98.1°F | Ht 71.5 in | Wt 262.0 lb

## 2020-09-03 DIAGNOSIS — H6981 Other specified disorders of Eustachian tube, right ear: Secondary | ICD-10-CM

## 2020-09-03 DIAGNOSIS — H6991 Unspecified Eustachian tube disorder, right ear: Secondary | ICD-10-CM

## 2020-09-03 DIAGNOSIS — E782 Mixed hyperlipidemia: Secondary | ICD-10-CM

## 2020-09-03 DIAGNOSIS — Z131 Encounter for screening for diabetes mellitus: Secondary | ICD-10-CM

## 2020-09-03 DIAGNOSIS — I10 Essential (primary) hypertension: Secondary | ICD-10-CM

## 2020-09-03 MED ORDER — PREDNISONE 20 MG PO TABS: 40.0000 mg | ORAL_TABLET | Freq: Every day | ORAL | 0 refills | Status: DC

## 2020-09-03 MED ORDER — LISINOPRIL 40 MG PO TABS: 40.0000 mg | ORAL_TABLET | Freq: Every day | ORAL | 1 refills | Status: DC

## 2020-09-03 MED ORDER — HYDROCHLOROTHIAZIDE 25 MG PO TABS: 25.0000 mg | ORAL_TABLET | Freq: Every day | ORAL | 1 refills | Status: DC
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
## 2020-09-03 NOTE — Patient Instructions (Addendum)
No change in medications at this time.  I will check some electrolytes today, but follow-up in the next 3 months for physical.  Can repeat cholesterol levels at that time as well as a 79-month blood sugar if needed based on today's readings.  Try to pack lunches or prepare food ahead of time to see if that will cut down on fast food intake.  Keep up the good work with walking.  Area on your abdomen appears to be a healed wound from possible abscess.  I do not see any signs of infection or concerns in that area at this time.  Right ear symptoms could be a return of eustachian tube dysfunction, I think it is reasonable to try 5 days of prednisone, but if symptoms are not improving would recommend follow-up with ear nose and throat.  Let me know if there are questions and thanks for coming in today.  If you have lab work done today you will be contacted with your lab results within the next 2 weeks.  If you have not heard from [place] then please contact us. The fastest way to get your results is to register for My Chart.   IF you received an x-ray today, you will receive an invoice from [facility]. Please contact [facility] at [phone] with questions or concerns regarding your invoice.   IF you received labwork today, you will receive an invoice from [place]. Please contact [facility] at [phone] with questions or concerns regarding your invoice.   Our billing staff will not be able to assist you with questions regarding bills from these companies.  You will be contacted with the lab results as soon as they are available. The fastest way to get your results is to activate your My Chart account. Instructions are located on the last page of this paperwork. If you have not heard from [place] regarding the results in 2 weeks, please contact this office.

## 2020-09-04 LAB — BASIC METABOLIC PANEL
BUN/Creatinine Ratio: 17 (ref 9–20)
BUN: 14 mg/dL (ref 6–24)
CO2: 25 mmol/L (ref 20–29)
Calcium: 9.1 mg/dL (ref 8.7–10.2)
Chloride: 100 mmol/L (ref 96–106)
Creatinine, Ser: 0.84 mg/dL (ref 0.76–1.27)
GFR calc Af Amer: 112 mL/min/{1.73_m2} (ref >59)
GFR calc non Af Amer: 97 mL/min/{1.73_m2} (ref >59)
Glucose: 95 mg/dL (ref 65–99)
Potassium: 4.5 mmol/L (ref 3.5–5.2)
Sodium: 138 mmol/L (ref 134–144)

## 2020-09-11 ENCOUNTER — Encounter: Payer: Self-pay | Admitting: Radiology

## 2020-10-03 ENCOUNTER — Ambulatory Visit
Admission: EM | Admit: 2020-10-03 | Discharge: 2020-10-03 | Disposition: A | Discharge status: Home / Self Care | Payer: Self-pay | Attending: Family | Admitting: Family

## 2020-10-03 ENCOUNTER — Ambulatory Visit (INDEPENDENT_AMBULATORY_CARE_PROVIDER_SITE_OTHER): Payer: Self-pay

## 2020-10-03 ENCOUNTER — Ambulatory Visit: Payer: Self-pay | Admitting: *Deleted

## 2020-10-03 DIAGNOSIS — W19XXXA Unspecified fall, initial encounter: Secondary | ICD-10-CM

## 2020-10-03 DIAGNOSIS — M79602 Pain in left arm: Secondary | ICD-10-CM

## 2020-10-03 DIAGNOSIS — M25512 Pain in left shoulder: Secondary | ICD-10-CM

## 2020-10-03 DIAGNOSIS — S43085A Other dislocation of left shoulder joint, initial encounter: Secondary | ICD-10-CM

## 2020-10-03 MED ORDER — KETOROLAC TROMETHAMINE 60 MG/2ML IM SOLN
60.0000 mg | Freq: Once | INTRAMUSCULAR | Status: AC
  Administered 2020-10-03: 60 mg via INTRAMUSCULAR

## 2020-10-03 NOTE — Discharge Instructions (Addendum)
We talked with the Orthopedic physician on call for [facility] and he recommends that you go to the ER now so that they can put your left shoulder back in it's socket. We gave you Toradol 60mg  now to help with pain. Wear the sling now for support. Follow-up at the ER as planned.

## 2020-10-03 NOTE — Telephone Encounter (Signed)
Pt called stating he fell on 10/02/20; he slipped in the kitchen and fell on his left arm; his arm is painful and he is having tingling in his fingers; his pain is rated 9-1 out of 10; recommendations made per nurse triage protocol; the pt verbalized understanding but refuses going to the ED or Urgent Care; the pt says he would like to see any provider in the practice; he is seen by Dr Carlota Raspberry, [place]; attempted to reach [employer] at practice but no answer; the pt can be contacted at [phone]  Reason for Disposition . Sounds like a serious injury to the triager  Answer Assessment - Initial Assessment Questions 1. MECHANISM: "How did the injury happen?"     Pt slipped and fell 2. ONSET: "When did the injury happen?" (Minutes or hours ago)      *10/02/20 at 2300 3. LOCATION: "Where is the injury located?" "Which arm?"    Left upper arm 4. APPEARANCE of INJURY: "What does the injury look like?"      Says arm looks normal 5. SEVERITY: "Can you use the arm normally?"      no 6. SWELLING or BRUISING: "is there any swelling or bruising?" If Yes, ask: "How large is it? (e.g., inches, centimeters)       Can not tell if bruising or swelling 7. PAIN: "Is there pain?" If Yes, ask: "How bad is the pain?"    (Scale 1-10; or mild, moderate, severe)   - NONE (0): no pain.   - MILD (1-3): doesn't interfere with normal activities   - MODERATE (4-7): interferes with normal activities (e.g., work or school) or awakens from sleep   - SEVERE (8-10): excruciating pain, unable to do any normal activities, unable to hold a cup of water     Pain rated 9-10 out of 10; pt unable to sleep due to pain 8. TETANUS: For any breaks in the skin, ask: "When was the last tetanus booster?"    n/a 9. OTHER SYMPTOMS: "Do you have any other symptoms?"  (e.g., numbness in hand)     numbness 10. PREGNANCY: "Is there any chance you are pregnant?" "When was your last menstrual period?"  n/a  Protocols used: ARM INJURY-A-AH

## 2020-10-03 NOTE — ED Triage Notes (Addendum)
Patient in with c/o fall that occurred last night States that he was walking in his home last night and slipped on a pile of clothes in the floor and fell on his left arm.  C/o tingling in fingers, +2 radial pulse, pain 10/10  Denies any loc, head trauma, or numbness to extremity

## 2020-10-03 NOTE — Telephone Encounter (Signed)
Pt. Called back. Asking for appointment. Per Hart Carwin in the practice, no availability. Pt. Instructed to go to UC/ED.
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
## 2020-10-16 DIAGNOSIS — S43015D Anterior dislocation of left humerus, subsequent encounter: Secondary | ICD-10-CM | POA: Diagnosis not present

## 2020-10-27 DIAGNOSIS — S43015D Anterior dislocation of left humerus, subsequent encounter: Secondary | ICD-10-CM | POA: Diagnosis not present

## 2020-10-30 DIAGNOSIS — S43015D Anterior dislocation of left humerus, subsequent encounter: Secondary | ICD-10-CM | POA: Diagnosis not present

## 2020-11-04 DIAGNOSIS — S43015D Anterior dislocation of left humerus, subsequent encounter: Secondary | ICD-10-CM | POA: Diagnosis not present

## 2020-11-11 DIAGNOSIS — S43015D Anterior dislocation of left humerus, subsequent encounter: Secondary | ICD-10-CM | POA: Diagnosis not present

## 2021-02-06 DIAGNOSIS — K7010 Alcoholic hepatitis without ascites: Secondary | ICD-10-CM | POA: Diagnosis not present

## 2021-02-06 DIAGNOSIS — Z20822 Contact with and (suspected) exposure to covid-19: Secondary | ICD-10-CM | POA: Diagnosis not present

## 2021-02-06 DIAGNOSIS — R945 Abnormal results of liver function studies: Secondary | ICD-10-CM | POA: Diagnosis not present

## 2021-02-06 DIAGNOSIS — R0602 Shortness of breath: Secondary | ICD-10-CM | POA: Diagnosis not present

## 2021-02-06 DIAGNOSIS — R7401 Elevation of levels of liver transaminase levels: Secondary | ICD-10-CM | POA: Diagnosis not present

## 2021-02-06 DIAGNOSIS — I10 Essential (primary) hypertension: Secondary | ICD-10-CM | POA: Diagnosis not present

## 2021-02-06 DIAGNOSIS — F1010 Alcohol abuse, uncomplicated: Secondary | ICD-10-CM | POA: Diagnosis not present

## 2021-02-06 DIAGNOSIS — R55 Syncope and collapse: Secondary | ICD-10-CM | POA: Diagnosis not present

## 2021-02-06 DIAGNOSIS — F1020 Alcohol dependence, uncomplicated: Secondary | ICD-10-CM | POA: Diagnosis not present

## 2021-02-06 DIAGNOSIS — R0789 Other chest pain: Secondary | ICD-10-CM | POA: Diagnosis not present

## 2021-02-06 DIAGNOSIS — R7989 Other specified abnormal findings of blood chemistry: Secondary | ICD-10-CM | POA: Diagnosis not present

## 2021-02-06 DIAGNOSIS — R079 Chest pain, unspecified: Secondary | ICD-10-CM | POA: Diagnosis not present

## 2021-02-07 DIAGNOSIS — R7989 Other specified abnormal findings of blood chemistry: Secondary | ICD-10-CM | POA: Diagnosis not present

## 2021-02-07 DIAGNOSIS — I10 Essential (primary) hypertension: Secondary | ICD-10-CM | POA: Diagnosis not present

## 2021-02-07 DIAGNOSIS — F1020 Alcohol dependence, uncomplicated: Secondary | ICD-10-CM | POA: Diagnosis not present

## 2021-02-07 DIAGNOSIS — R55 Syncope and collapse: Secondary | ICD-10-CM | POA: Diagnosis not present

## 2021-02-07 DIAGNOSIS — R7401 Elevation of levels of liver transaminase levels: Secondary | ICD-10-CM | POA: Diagnosis not present

## 2021-02-07 DIAGNOSIS — R079 Chest pain, unspecified: Secondary | ICD-10-CM | POA: Diagnosis not present

## 2021-02-08 DIAGNOSIS — R55 Syncope and collapse: Secondary | ICD-10-CM | POA: Diagnosis not present

## 2021-02-08 DIAGNOSIS — R7401 Elevation of levels of liver transaminase levels: Secondary | ICD-10-CM | POA: Diagnosis not present

## 2021-02-08 DIAGNOSIS — R079 Chest pain, unspecified: Secondary | ICD-10-CM | POA: Diagnosis not present

## 2021-02-08 DIAGNOSIS — R7989 Other specified abnormal findings of blood chemistry: Secondary | ICD-10-CM | POA: Diagnosis not present

## 2021-02-08 DIAGNOSIS — Z720 Tobacco use: Secondary | ICD-10-CM | POA: Diagnosis not present

## 2021-02-08 DIAGNOSIS — F1020 Alcohol dependence, uncomplicated: Secondary | ICD-10-CM | POA: Diagnosis not present

## 2021-02-08 DIAGNOSIS — I10 Essential (primary) hypertension: Secondary | ICD-10-CM | POA: Diagnosis not present

## 2021-02-09 DIAGNOSIS — R7989 Other specified abnormal findings of blood chemistry: Secondary | ICD-10-CM | POA: Diagnosis not present

## 2021-02-09 DIAGNOSIS — R079 Chest pain, unspecified: Secondary | ICD-10-CM | POA: Diagnosis not present

## 2021-02-09 DIAGNOSIS — F1020 Alcohol dependence, uncomplicated: Secondary | ICD-10-CM | POA: Diagnosis not present

## 2021-02-09 DIAGNOSIS — R55 Syncope and collapse: Secondary | ICD-10-CM | POA: Diagnosis not present

## 2021-02-09 DIAGNOSIS — R7401 Elevation of levels of liver transaminase levels: Secondary | ICD-10-CM | POA: Diagnosis not present

## 2021-02-09 DIAGNOSIS — I10 Essential (primary) hypertension: Secondary | ICD-10-CM | POA: Diagnosis not present

## 2021-02-12 ENCOUNTER — Other Ambulatory Visit: Payer: Self-pay

## 2021-02-12 ENCOUNTER — Ambulatory Visit: Payer: BC Managed Care – PPO | Admitting: Family Medicine

## 2021-02-12 DIAGNOSIS — R002 Palpitations: Secondary | ICD-10-CM

## 2021-02-12 DIAGNOSIS — R55 Syncope and collapse: Secondary | ICD-10-CM

## 2021-02-12 DIAGNOSIS — I10 Essential (primary) hypertension: Secondary | ICD-10-CM

## 2021-02-12 DIAGNOSIS — R079 Chest pain, unspecified: Secondary | ICD-10-CM

## 2021-02-12 DIAGNOSIS — R7989 Other specified abnormal findings of blood chemistry: Secondary | ICD-10-CM

## 2021-02-12 MED ORDER — LISINOPRIL 40 MG PO TABS: 40.0000 mg | ORAL_TABLET | Freq: Every day | ORAL | 1 refills | Status: DC

## 2021-02-12 MED ORDER — HYDROCHLOROTHIAZIDE 25 MG PO TABS: 25.0000 mg | ORAL_TABLET | Freq: Every day | ORAL | 1 refills | Status: DC
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
## 2021-02-12 NOTE — Patient Instructions (Addendum)
   Try to decrease alcohol by 1/2 for now. Avoid driving. I will refer you to cardiology. If no source at cardiology, I can refer you to neurology if needed.  If any return of symptoms that you had Friday, be seen in ER.   Thanks for coming in today. Please let me know if there are questions.   If you have lab work done today you will be contacted with your lab results within the next 2 weeks.  If you have not heard from [place] then please contact us. The fastest way to get your results is to register for My Chart.   IF you received an x-ray today, you will receive an invoice from [facility]. Please contact [facility] Radiology at [phone] with questions or concerns regarding your invoice.   IF you received labwork today, you will receive an invoice from [place]. Please contact [facility] at [phone] with questions or concerns regarding your invoice.   Our billing staff will not be able to assist you with questions regarding bills from these companies.  You will be contacted with the lab results as soon as they are available. The fastest way to get your results is to activate your My Chart account. Instructions are located on the last page of this paperwork. If you have not heard from [place] regarding the results in 2 weeks, please contact this office.

## 2021-02-13 ENCOUNTER — Telehealth: Payer: Self-pay

## 2021-02-13 LAB — CBC
Hematocrit: 47.1 % (ref 37.5–51.0)
Hemoglobin: 16.6 g/dL (ref 13.0–17.7)
MCH: 31.7 pg (ref 26.6–33.0)
MCHC: 35.2 g/dL (ref 31.5–35.7)
MCV: 90 fL (ref 79–97)
Platelets: 234 10*3/uL (ref 150–450)
RBC: 5.23 x10E6/uL (ref 4.14–5.80)
RDW: 12.3 % (ref 11.6–15.4)
WBC: 8.3 10*3/uL (ref 3.4–10.8)

## 2021-02-13 LAB — COMPREHENSIVE METABOLIC PANEL
ALT: 87 IU/L — ABNORMAL HIGH (ref 0–44)
AST: 39 IU/L (ref 0–40)
Albumin/Globulin Ratio: 1.8 (ref 1.2–2.2)
Albumin: 4.3 g/dL (ref 3.8–4.9)
Alkaline Phosphatase: 56 IU/L (ref 44–121)
BUN/Creatinine Ratio: 18 (ref 9–20)
BUN: 15 mg/dL (ref 6–24)
Bilirubin Total: 0.7 mg/dL (ref 0.0–1.2)
CO2: 24 mmol/L (ref 20–29)
Calcium: 9.5 mg/dL (ref 8.7–10.2)
Chloride: 96 mmol/L (ref 96–106)
Creatinine, Ser: 0.85 mg/dL (ref 0.76–1.27)
Globulin, Total: 2.4 g/dL (ref 1.5–4.5)
Glucose: 105 mg/dL — ABNORMAL HIGH (ref 65–99)
Potassium: 4.8 mmol/L (ref 3.5–5.2)
Sodium: 135 mmol/L (ref 134–144)
Total Protein: 6.7 g/dL (ref 6.0–8.5)
eGFR: 101 mL/min/{1.73_m2} (ref >59)

## 2021-02-13 LAB — TSH: TSH: 0.568 u[IU]/mL (ref 0.450–4.500)

## 2021-02-13 NOTE — Telephone Encounter (Signed)
Pt. Came into office and requested disability paperwork be filled by Dr. Carlota Raspberry. Unable to collect payment at time of filling. PT. Informed and will pay on pickup of papers.

## 2021-02-15 ENCOUNTER — Encounter: Payer: Self-pay | Admitting: Family Medicine

## 2021-02-15 ENCOUNTER — Other Ambulatory Visit: Payer: Self-pay | Admitting: Family Medicine

## 2021-02-15 DIAGNOSIS — R7989 Other specified abnormal findings of blood chemistry: Secondary | ICD-10-CM

## 2021-02-15 NOTE — Progress Notes (Signed)
Ep

## 2021-02-17 NOTE — Telephone Encounter (Signed)
Received paperwork on 3/8. Scanned to e-mail. Hardcopy in fmla box

## 2021-02-17 NOTE — Telephone Encounter (Signed)
Placed copy in [place] file to be signed

## 2021-02-20 NOTE — Telephone Encounter (Signed)
Patient picked up FMLA paperwork and paid $15 fee. He wasn't able to pay when the paperwork was dropped off because the computer system was down.

## 2021-02-24 ENCOUNTER — Encounter: Payer: Self-pay | Admitting: *Deleted

## 2021-02-24 ENCOUNTER — Ambulatory Visit (INDEPENDENT_AMBULATORY_CARE_PROVIDER_SITE_OTHER): Payer: Self-pay | Admitting: Cardiology

## 2021-02-24 ENCOUNTER — Encounter: Payer: Self-pay | Admitting: Cardiology

## 2021-02-24 ENCOUNTER — Other Ambulatory Visit: Payer: Self-pay

## 2021-02-24 ENCOUNTER — Ambulatory Visit (INDEPENDENT_AMBULATORY_CARE_PROVIDER_SITE_OTHER): Payer: BC Managed Care – PPO

## 2021-02-24 VITALS — BP 132/76 | HR 67 | Ht 72.0 in | Wt 257.4 lb

## 2021-02-24 DIAGNOSIS — R079 Chest pain, unspecified: Secondary | ICD-10-CM

## 2021-02-24 DIAGNOSIS — R55 Syncope and collapse: Secondary | ICD-10-CM

## 2021-02-24 DIAGNOSIS — R0683 Snoring: Secondary | ICD-10-CM

## 2021-02-24 DIAGNOSIS — R400 Somnolence: Secondary | ICD-10-CM

## 2021-02-24 DIAGNOSIS — I10 Essential (primary) hypertension: Secondary | ICD-10-CM

## 2021-02-24 NOTE — Progress Notes (Signed)
Cardiology Office Note:    Date:  02/26/2021   ID:  Jonathon Fischer, DOB [birth year], MRN [id number]  PCP:  Wendie Agreste, MD  Cardiologist:  No primary care provider on file.  Electrophysiologist:  None   Referring MD: Wendie Agreste, MD   Chief Complaint  Patient presents with  . Loss of Consciousness    History of Present Illness:    Jonathon Fischer is a 60 y.o. male with a hx of hypertension who is referred by Dr. Carlota Raspberry for evaluation of chest pain and syncope.  He was admitted to [facility] on 02/06/2021 after passing out behind the wheel of a tour bus.  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  Within 1 minute he passed out.  He has not had any syncopal episodes before or since this time.  Does state he has chest pain about once per week, describes as left sided chest pain that can occur at rest.  He started walking in the last week, over 3 miles.  Feels short of breath with exertion but denies chest pain.  Does report he has intermittent palpitations where he feels like his heart is racing.  He has been told he snores and feels tired throughout the day.  He smokes 3 cigarettes/day.  No history of heart disease in his immediate family.  Echocardiogram showed borderline LV dilatation, normal wall thickness, EF greater than 62%, normal diastolic function, normal RV function, no significant valvular disease.  Lexiscan sestamibi stress test showed normal perfusion, EF 50%.  Past Medical History:  Diagnosis Date  . Hypertension     Past Surgical History:  Procedure Laterality Date  . FEMUR FRACTURE SURGERY     right    Current Medications: Current Meds  Medication Sig  . hydrochlorothiazide (HYDRODIURIL) 25 MG tablet Take 1 tablet (25 mg total) by mouth daily.  Marland Kitchen lisinopril (ZESTRIL) 40 MG tablet Take 1 tablet (40 mg total) by mouth daily.     Allergies:   Patient has no  known allergies.   Social History   Socioeconomic History  . Marital status: Single    Spouse name: Not on file  . Number of children: Not on file  . Years of education: Not on file  . Highest education level: Not on file  Occupational History  . Not on file  Tobacco Use  . Smoking status: Former Smoker    Quit date: 09/07/2002    Years since quitting: 18.4  . Smokeless tobacco: Never Used  Substance and Sexual Activity  . Alcohol use: Yes    Alcohol/week: 12.0 standard drinks    Types: 12 Standard drinks or equivalent per week  . Drug use: No  . Sexual activity: Not on file  Other Topics Concern  . Not on file  Social History Narrative  . Not on file   Social Determinants of Health   Financial Resource Strain: Not on file  Food Insecurity: Not on file  Transportation Needs: Not on file  Physical Activity: Not on file  Stress: Not on file  Social Connections: Not on file     Family History: The patient's family history includes Cancer in his maternal grandfather.  ROS:   Please see the history of present illness.     All other systems reviewed and are negative.  EKGs/Labs/Other Studies Reviewed:    The following studies were reviewed today:   EKG:  EKG is  ordered today.  The ekg ordered today demonstrates normal sinus rhythm, rate 67, no ST/T wave abnormalities  Recent Labs: 02/12/2021: ALT 87; BUN 15; Creatinine, Ser 0.85; Hemoglobin 16.6; Platelets 234; Potassium 4.8; Sodium 135; TSH 0.568  Recent Lipid Panel    Component Value Date/Time   CHOL 183 03/05/2020 1156   TRIG 108 03/05/2020 1156   HDL 59 03/05/2020 1156   CHOLHDL 3.1 03/05/2020 1156   CHOLHDL 2.7 05/13/2016 1425   VLDL 14 05/13/2016 1425   LDLCALC 105 (H) 03/05/2020 1156    Physical Exam:    VS:  BP 132/76   Pulse 67   Ht 6' (1.829 m)   Wt 257 lb 6.4 oz (116.8 kg)   SpO2 97%   BMI 34.91 kg/m     Wt Readings from Last 3 Encounters:  02/24/21 257 lb 6.4 oz (116.8 kg)  09/03/20  262 lb (118.8 kg)  03/05/20 266 lb (120.7 kg)     GEN: Well nourished, well developed in no acute distress HEENT: Normal NECK: No JVD; No carotid bruits LYMPHATICS: No lymphadenopathy CARDIAC: RRR, no murmurs, rubs, gallops RESPIRATORY:  Clear to auscultation without rales, wheezing or rhonchi  ABDOMEN: Soft, non-tender, non-distended MUSCULOSKELETAL:  No edema; No deformity  SKIN: Warm and dry NEUROLOGIC:  Alert and oriented x 3 PSYCHIATRIC:  Normal affect   ASSESSMENT:    1. Syncope and collapse   2. Snoring   3. Daytime somnolence   4. Essential hypertension   5. Chest pain of uncertain etiology    PLAN:    Syncope: Unclear cause.  Did report chest pain during episode.  Echocardiogram showed borderline LV dilatation, normal wall thickness, EF greater than 70%, normal diastolic function, normal RV function, no significant valvular disease.  Lexiscan Myoview showed normal perfusion, EF 50%. -Recommend Zio patch x14 days evaluate for arrhythmia.  If unremarkable, may need loop recorder  Chest pain: Reports atypical chest pain, including preceding recent syncopal episode.  Lexiscan Myoview showed normal perfusion as above.  Hypertension: On lisinopril 40 mg daily, hydrochlorthiazide 25 mg.  Appears controlled.   Snoring/daytime somnolence: Will check sleep study  RTC in 2 months  Medication Adjustments/Labs and Tests Ordered: Current medicines are reviewed at length with the patient today.  Concerns regarding medicines are outlined above.  Orders Placed This Encounter  Procedures  . LONG TERM MONITOR-LIVE TELEMETRY (3-14 DAYS)  . EKG 12-Lead  . Split night study   No orders of the defined types were placed in this encounter.   Patient Instructions  Medication Instructions:  Your physician recommends that you continue on your current medications as directed. Please refer to the Current Medication list given to you today.  Testing/Procedures: Your physician has  recommended that you have a sleep study. This test records several body functions during sleep, including: brain activity, eye movement, oxygen and carbon dioxide blood levels, heart rate and rhythm, breathing rate and rhythm, the flow of air through your mouth and nose, snoring, body muscle movements, and chest and belly movement. --this must approved by insurance prior to scheduling  [place] Monitor Instructions   Your physician has requested you wear your ZIO patch monitor___14___days.   This is a single patch monitor.  Irhythm supplies one patch monitor per enrollment.  Additional stickers are not available.   Please do not apply patch if you will be having a Nuclear Stress Test, Echocardiogram, Cardiac CT, MRI, or Chest Xray during the time frame you would  be wearing the monitor. The patch cannot be worn during these tests.  You cannot remove and re-apply the ZIO XT patch monitor.   Your ZIO patch monitor will be sent USPS Priority mail from [facility] directly to your home address. The monitor may also be mailed to a PO BOX if home delivery is not available.   It may take 3-5 days to receive your monitor after you have been enrolled.   Once you have received you monitor, please review enclosed instructions.  Your monitor has already been registered assigning a specific monitor serial # to you.   Applying the monitor   Shave hair from upper left chest.   Hold abrader disc by orange tab.  Rub abrader in 40 strokes over left upper chest as indicated in your monitor instructions.   Clean area with 4 enclosed alcohol pads .  Use all pads to assure are is cleaned thoroughly.  Let dry.   Apply patch as indicated in monitor instructions.  Patch will be place under collarbone on left side of chest with arrow pointing upward.   Rub patch adhesive wings for 2 minutes.Remove white label marked "1".  Remove white label marked "2".  Rub patch adhesive wings for 2 additional  minutes.   While looking in a mirror, press and release button in center of patch.  A small green light will flash 3-4 times .  This will be your only indicator the monitor has been turned on.     Do not shower for the first 24 hours.  You may shower after the first 24 hours.   Press button if you feel a symptom. You will hear a small click.  Record Date, Time and Symptom in the Patient Log Book.   When you are ready to remove patch, follow instructions on last 2 pages of Patient Log Book.  Stick patch monitor onto last page of Patient Log Book.   Place Patient Log Book in [place] box.  Use locking tab on box and tape box closed securely.  The [employer] and [employer] has [employer] on it.  Please place in mailbox as soon as possible.  Your physician should have your test results approximately 7 days after the monitor has been mailed back to [facility].   Call [place] at [phone] if you have questions regarding your ZIO XT patch monitor.  Call them immediately if you see an orange light blinking on your monitor.   If your monitor falls off in less than 4 days contact our Monitor department at [phone].  If your monitor becomes loose or falls off after 4 days call [employer] at [phone] for suggestions on securing your monitor.    Follow-Up: At [facility], you and your health needs are our priority.  As part of our continuing mission to provide you with exceptional heart care, we have created designated Provider Care Teams.  These Care Teams include your primary Cardiologist (physician) and Advanced Practice Providers (APPs -  Physician Assistants and Nurse Practitioners) who all work together to provide you with the care you need, when you need it.  We recommend signing up for the patient portal called "MyChart".  Sign up information is provided on this After Visit Summary.  MyChart is used to connect with patients for Virtual Visits (Telemedicine).   Patients are able to view lab/test results, encounter notes, upcoming appointments, etc.  Non-urgent messages can be sent to your provider as well.   To learn  more about what you can do with MyChart, go to [email or web address].    Your next appointment:   2 month(s)  The format for your next appointment:   In Person  Provider:   Oswaldo Milian, MD       Signed, Donato Heinz, MD  02/26/2021 5:43 PM    [place]

## 2021-02-24 NOTE — Patient Instructions (Signed)
Medication Instructions:  Your physician recommends that you continue on your current medications as directed. Please refer to the Current Medication list given to you today.  Testing/Procedures: Your physician has recommended that you have a sleep study. This test records several body functions during sleep, including: brain activity, eye movement, oxygen and carbon dioxide blood levels, heart rate and rhythm, breathing rate and rhythm, the flow of air through your mouth and nose, snoring, body muscle movements, and chest and belly movement. --this must approved by insurance prior to scheduling  [place] Monitor Instructions   Your physician has requested you wear your ZIO patch monitor___14___days.   This is a single patch monitor.  [employer] supplies one patch monitor per enrollment.  Additional stickers are not available.   Please do not apply patch if you will be having a Nuclear Stress Test, Echocardiogram, Cardiac CT, MRI, or Chest Xray during the time frame you would be wearing the monitor. The patch cannot be worn during these tests.  You cannot remove and re-apply the ZIO XT patch monitor.   Your ZIO patch monitor will be sent USPS Priority mail from [facility] directly to your home address. The monitor may also be mailed to a PO BOX if home delivery is not available.   It may take 3-5 days to receive your monitor after you have been enrolled.   Once you have received you monitor, please review enclosed instructions.  Your monitor has already been registered assigning a specific monitor serial # to you.   Applying the monitor   Shave hair from upper left chest.   Hold abrader disc by orange tab.  Rub abrader in 40 strokes over left upper chest as indicated in your monitor instructions.   Clean area with 4 enclosed alcohol pads .  Use all pads to assure are is cleaned thoroughly.  Let dry.   Apply patch as indicated in monitor instructions.  Patch will be place  under collarbone on left side of chest with arrow pointing upward.   Rub patch adhesive wings for 2 minutes.Remove white label marked "1".  Remove white label marked "2".  Rub patch adhesive wings for 2 additional minutes.   While looking in a mirror, press and release button in center of patch.  A small green light will flash 3-4 times .  This will be your only indicator the monitor has been turned on.     Do not shower for the first 24 hours.  You may shower after the first 24 hours.   Press button if you feel a symptom. You will hear a small click.  Record Date, Time and Symptom in the Patient Log Book.   When you are ready to remove patch, follow instructions on last 2 pages of Patient Log Book.  Stick patch monitor onto last page of Patient Log Book.   Place Patient Log Book in [place] box.  Use locking tab on box and tape box closed securely.  The [employer] and [employer] has [employer] on it.  Please place in mailbox as soon as possible.  Your physician should have your test results approximately 7 days after the monitor has been mailed back to [facility].   Call [place] at [phone] if you have questions regarding your ZIO XT patch monitor.  Call them immediately if you see an orange light blinking on your monitor.   If your monitor falls off in less than 4 days contact our Monitor  department at [phone].  If your monitor becomes loose or falls off after 4 days call [employer] at [phone] for suggestions on securing your monitor.    Follow-Up: At [facility], you and your health needs are our priority.  As part of our continuing mission to provide you with exceptional heart care, we have created designated Provider Care Teams.  These Care Teams include your primary Cardiologist (physician) and Advanced Practice Providers (APPs -  Physician Assistants and Nurse Practitioners) who all work together to provide you with the care you need, when you need  it.  We recommend signing up for the patient portal called "MyChart".  Sign up information is provided on this After Visit Summary.  MyChart is used to connect with patients for Virtual Visits (Telemedicine).  Patients are able to view lab/test results, encounter notes, upcoming appointments, etc.  Non-urgent messages can be sent to your provider as well.   To learn more about what you can do with MyChart, go to [email or web address].    Your next appointment:   2 month(s)  The format for your next appointment:   In Person  Provider:   Oswaldo Milian, MD

## 2021-02-24 NOTE — Progress Notes (Signed)
Patient ID: Jonathon Fischer, male   DOB: 02-Jun-1961, 60 y.o.   MRN: [id number] Patient enrolled for 14 day ZIO AT long term monitor- Live Telemetry to be shipped to his home.   Letter with instructions and Patient Assistance Program information mailed to patient.

## 2021-02-26 ENCOUNTER — Telehealth: Payer: Self-pay | Admitting: *Deleted

## 2021-02-26 NOTE — Telephone Encounter (Signed)
Left message to return a call with insurance information.

## 2021-03-03 ENCOUNTER — Other Ambulatory Visit: Payer: Self-pay | Admitting: Cardiology

## 2021-03-03 ENCOUNTER — Telehealth: Payer: Self-pay | Admitting: *Deleted

## 2021-03-03 DIAGNOSIS — R400 Somnolence: Secondary | ICD-10-CM

## 2021-03-03 DIAGNOSIS — R0683 Snoring: Secondary | ICD-10-CM

## 2021-03-03 NOTE — Telephone Encounter (Signed)
Left HST appointment details on Voice mail.

## 2021-03-03 NOTE — Telephone Encounter (Signed)
Patient called back and provided [employer] information to me.

## 2021-03-07 DIAGNOSIS — R55 Syncope and collapse: Secondary | ICD-10-CM | POA: Diagnosis not present

## 2021-03-08 DIAGNOSIS — R55 Syncope and collapse: Secondary | ICD-10-CM | POA: Diagnosis not present

## 2021-03-09 ENCOUNTER — Encounter: Payer: Self-pay | Admitting: Family Medicine

## 2021-03-17 ENCOUNTER — Telehealth: Payer: Self-pay | Admitting: Cardiology

## 2021-03-17 NOTE — Telephone Encounter (Signed)
Patient is calling to talk with Dr. Gardiner Rhyme or nurse. Please call back

## 2021-03-17 NOTE — Telephone Encounter (Signed)
Spoke with patient in regards to paperwork that he needed Dr.Schumann to complete, patient will be having paperwork faxed to office.

## 2021-03-17 NOTE — Telephone Encounter (Signed)
Spoke with pt, he reports he would like to talk with katlyn, aware will forward message to her.

## 2021-03-23 NOTE — Telephone Encounter (Signed)
Patient called to follow up on the status of his forms. Please call patient to discuss status of forms

## 2021-03-24 ENCOUNTER — Encounter: Payer: Self-pay | Admitting: Cardiology

## 2021-03-24 ENCOUNTER — Telehealth: Payer: Self-pay | Admitting: Cardiology

## 2021-03-25 ENCOUNTER — Telehealth: Payer: Self-pay | Admitting: Cardiology

## 2021-03-25 NOTE — Telephone Encounter (Signed)
Made in error

## 2021-03-25 NOTE — Telephone Encounter (Signed)
Forms received from [place] / [employer], forms were put in providers box on 4/13.

## 2021-04-10 ENCOUNTER — Ambulatory Visit (HOSPITAL_BASED_OUTPATIENT_CLINIC_OR_DEPARTMENT_OTHER): Payer: BC Managed Care – PPO | Attending: Cardiology | Admitting: Cardiovascular Disease

## 2021-04-10 ENCOUNTER — Other Ambulatory Visit: Payer: Self-pay

## 2021-04-10 DIAGNOSIS — R400 Somnolence: Secondary | ICD-10-CM | POA: Diagnosis not present

## 2021-04-10 DIAGNOSIS — R0683 Snoring: Secondary | ICD-10-CM | POA: Diagnosis not present

## 2021-04-10 DIAGNOSIS — Z0279 Encounter for issue of other medical certificate: Secondary | ICD-10-CM

## 2021-04-10 DIAGNOSIS — R0902 Hypoxemia: Secondary | ICD-10-CM | POA: Insufficient documentation

## 2021-04-10 DIAGNOSIS — G4733 Obstructive sleep apnea (adult) (pediatric): Secondary | ICD-10-CM

## 2021-04-10 NOTE — Telephone Encounter (Signed)
Forms received and faxed to case worker, patient picked up copy of forms at [place] on 4/29.

## 2021-04-16 ENCOUNTER — Encounter (HOSPITAL_BASED_OUTPATIENT_CLINIC_OR_DEPARTMENT_OTHER): Payer: Self-pay | Admitting: Cardiovascular Disease

## 2021-04-16 NOTE — Procedures (Signed)
     Patient Name: Jonathon Fischer, Jonathon Fischer Date: 04/12/2021 Gender: Male D.O.B: 02-16-1961 Age (years): 59 Referring Provider: Oswaldo Milian Height (inches): 55 Interpreting Physician: Shelva Majestic MD, ABSM Weight (lbs): 265 RPSGT: Jacolyn Reedy BMI: 36 MRN: [id number] Neck Size: 17.00  CLINICAL INFORMATION Sleep Study Type: HST  Indication for sleep study: snoring, fatigue  Epworth Sleepiness Score: 1  SLEEP STUDY TECHNIQUE A multi-channel overnight portable sleep study was performed. The channels recorded were: nasal airflow, thoracic respiratory movement, and oxygen saturation with a pulse oximetry. Snoring was also monitored.  MEDICATIONS hydrochlorothiazide (HYDRODIURIL) 25 MG tablet lisinopril (ZESTRIL) 40 MG tablet Patient self administered medications include: N/A.  SLEEP ARCHITECTURE Patient was studied for 399.7 minutes. The sleep efficiency was 100.0 % and the patient was supine for 43.8%. The arousal index was 0.0 per hour.  RESPIRATORY PARAMETERS The overall AHI was 54.9 per hour, with a central apnea index of 0 per hour. There was a severe positional component with supine sleep AHI 112.2/h versus non-supine AHI 10.8/h.  The oxygen nadir was 82% during sleep. Time spent < 89% was 11 minutes.  CARDIAC DATA Mean heart rate during sleep was 61.2 bpm.  IMPRESSIONS - Severe obstructive sleep apnea occurred during this study (AHI 54.9/h). Events were very severe with supine sleep (AHI 112.2/h). - Moderate oxygen desaturation to a nadir of 82%. - Patient snored 0.4% during the sleep.  DIAGNOSIS - Obstructive Sleep Apnea (G47.33) - Nocturnal Hypoxemia (G47.36)  RECOMMENDATIONS - In this patient with severe sleep apnea recommend an in-lab therapeutic CPAP titration study to determine optimal pressure to alleviate the patient's sleep disordered breathing.  - Effort should be made to optimize nasal and oropharyngeal patency. - Positional therapy  avoiding supine position during sleep. - Avoid alcohol, sedatives and other CNS depressants that may worsen sleep apnea and disrupt normal sleep architecture. - Sleep hygiene should be reviewed to assess factors that may improve sleep quality. - Weight management (BMI 36) and regular exercise should be initiated or continued. - Recommend a download and sleep clinic evaluation after initiation of therapy.   [Electronically signed] 04/16/2021 09:38 AM  Shelva Majestic MD, [facility] [place], American Board of Sleep Medicine   NPI: [id number] [place] PH: [phone]   FX: [phone] [place]

## 2021-04-30 ENCOUNTER — Encounter: Payer: Self-pay | Admitting: Cardiology

## 2021-04-30 ENCOUNTER — Ambulatory Visit: Payer: BC Managed Care – PPO | Admitting: Cardiology

## 2021-04-30 ENCOUNTER — Other Ambulatory Visit: Payer: Self-pay

## 2021-04-30 VITALS — BP 130/78 | HR 67 | Ht 71.0 in | Wt 253.4 lb

## 2021-04-30 DIAGNOSIS — R55 Syncope and collapse: Secondary | ICD-10-CM

## 2021-04-30 DIAGNOSIS — I10 Essential (primary) hypertension: Secondary | ICD-10-CM

## 2021-04-30 DIAGNOSIS — R079 Chest pain, unspecified: Secondary | ICD-10-CM

## 2021-04-30 DIAGNOSIS — I493 Ventricular premature depolarization: Secondary | ICD-10-CM | POA: Diagnosis not present

## 2021-04-30 DIAGNOSIS — G4733 Obstructive sleep apnea (adult) (pediatric): Secondary | ICD-10-CM
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
## 2021-04-30 NOTE — Patient Instructions (Signed)
Medication Instructions:  Your physician recommends that you continue on your current medications as directed. Please refer to the Current Medication list given to you today.  *If you need a refill on your cardiac medications before your next appointment, please call your pharmacy*  Testing/Procedures: Your physician has requested that you have a cardiac MRI. Cardiac MRI uses a computer to create images of your heart as its beating, producing both still and moving pictures of your heart and major blood vessels. For further information please visit [email or web address] Please follow the instruction sheet given to you today for more information.  Your physician has recommended that you have a loop recorder implant. Please follow the instructions below, located under the special instructions section.  Follow-Up: Your physician recommends that you schedule a wound check appointment 10-14 days, after your procedure on 05/23, with the device clinic.  Follow-Up: At [facility], you and your health needs are our priority.  As part of our continuing mission to provide you with exceptional heart care, we have created designated Provider Care Teams.  These Care Teams include your primary Cardiologist (physician) and Advanced Practice Providers (APPs -  Physician Assistants and Nurse Practitioners) who all work together to provide you with the care you need, when you need it.  Your next appointment:   3 month(s) with Dr. Gardiner Rhyme    Special Instructions:   [facility] at [place], [place]  [place], [place] [address]  Phone: [phone] Fax: [phone]   You are scheduled for a Loop Recorder Implant on Monday 05/04/2021  at 1:00 PM with Dr. Sallyanne Kuster. This will take place at [place], [address].    Please arrive at your appointment 15-20 minutes early.    You do not need to be fasting.    The procedure is performed with local anesthesia.  You will not receive sedatives nor will an IV be placed.    Wash your chest and neck with the surgical soap the evening before and the morning of your procedure. Please following the washing instructions provided.    As with all surgical implants, there is a small risk of infection. If an infection occurs, the device will be removed. To help reduce the risk, please use the surgical scrub provided. Additional antiseptic precautions will be taken at the time of the procedure.    Please bring your insurance cards.   *Please note that scheduled loop recorder implants may need to be rescheduled if Dr. Sallyanne Kuster has a procedure urgently added on at the hospital   Preparing for the Procedure  Before the procedure, you can play an important role. Because skin is not sterile, your skin needs to be as free of germs as possible. You can reduce the number of germs on your skin by washing with CHG (chlorhexidine gluconate) Soap before the procedure. CHG is an antiseptic cleaner which kills germs and bonds with the skin to continue killing germs even after washing.  Please do not use if you have an allergy to CHG or antibacterial soaps. If your skin becomes reddened/irritated, STOP using the CHG.  DO NOT SHAVE (including legs and underarms) for at least 48 hours prior to first CHG shower. It is OK to shave your face.  Please follow these instructions carefully: 1. Shower the night before the procedure and the morning of with CHG Soap. 2. If you chose to wash your hair, wash your hair first as usual with your normal shampoo/conditioner. 3. After  you shampoo/condition, rinse you hair and body thoroughly to remove shampoo/conditioner. 4. Use CHG as you would any other liquid soap. You can apply CHG directly to the skin and wash gently with a loofah or a clean washcloth. 5. Apply the CHG Soap to your body ONLY FROM THE NECK DOWN. Do not use on open wounds or open sores. Avoid contact with your eyes, ears,  mouth, and genitals (private parts).  6. Wash thoroughly, paying special attention to the area where your surgery will be performed. 7. Thoroughly rinse your body with warm water from the neck down. 8. DO NOT shower/wash with your normal soap after using and rinsing off the CHG Soap. 9. Pat yourself dry with a clean towel. 10. Wear clean pajamas to bed. 11. Place clean sheets on your bed the night of your first shower and do not sleep with pets..  Day of Surgery: Shower with the CHG Soap following the instructions listed above. DO NOT apply deodorants or lotions.

## 2021-05-01 ENCOUNTER — Telehealth: Payer: Self-pay | Admitting: Licensed Clinical Social Worker

## 2021-05-01 LAB — BASIC METABOLIC PANEL
BUN/Creatinine Ratio: 19 (ref 9–20)
BUN: 19 mg/dL (ref 6–24)
CO2: 24 mmol/L (ref 20–29)
Calcium: 9.4 mg/dL (ref 8.7–10.2)
Chloride: 101 mmol/L (ref 96–106)
Creatinine, Ser: 1 mg/dL (ref 0.76–1.27)
Glucose: 85 mg/dL (ref 65–99)
Potassium: 5 mmol/L (ref 3.5–5.2)
Sodium: 141 mmol/L (ref 134–144)
eGFR: 87 mL/min/{1.73_m2} (ref >59)

## 2021-05-01 LAB — CBC
Hematocrit: 45.2 % (ref 37.5–51.0)
Hemoglobin: 15.4 g/dL (ref 13.0–17.7)
MCH: 31.8 pg (ref 26.6–33.0)
MCHC: 34.1 g/dL (ref 31.5–35.7)
MCV: 93 fL (ref 79–97)
Platelets: 206 10*3/uL (ref 150–450)
RBC: 4.84 x10E6/uL (ref 4.14–5.80)
RDW: 12.8 % (ref 11.6–15.4)
WBC: 7.6 10*3/uL (ref 3.4–10.8)
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
## 2021-05-04 ENCOUNTER — Other Ambulatory Visit: Payer: Self-pay

## 2021-05-04 ENCOUNTER — Ambulatory Visit (INDEPENDENT_AMBULATORY_CARE_PROVIDER_SITE_OTHER): Payer: BC Managed Care – PPO | Admitting: Cardiovascular Disease

## 2021-05-04 ENCOUNTER — Telehealth: Payer: Self-pay | Admitting: *Deleted

## 2021-05-04 DIAGNOSIS — R55 Syncope and collapse: Secondary | ICD-10-CM | POA: Diagnosis not present

## 2021-05-04 DIAGNOSIS — Z95818 Presence of other cardiac implants and grafts: Secondary | ICD-10-CM

## 2021-05-04 MED ORDER — LIDOCAINE-EPINEPHRINE 1 %-1:100000 IJ SOLN: 10.0000 mL | Freq: Once | INTRAMUSCULAR | Status: AC

## 2021-05-04 NOTE — Telephone Encounter (Signed)
Patient walked in c/o bleeding at his incision site from loop recorder insert this morning with Dr. Sallyanne Kuster.     Assessed site- dressing to site, small amount of red blood noted to dressing, no active bleeding.  Advised if he notices increased amount of blood to dressing, ok to hold local pressure for a few minutes.   Reassured patient and advised to call back with any questions/concerns.    Routed to MD to make aware.

## 2021-05-04 NOTE — Telephone Encounter (Signed)
Thank you :)

## 2021-05-04 NOTE — Patient Instructions (Signed)
Discharge Instructions for  Loop Recorder Implant    Follow up: Keep your wound check appointment on 05/18/21 at 11:50 am. This will be a video visit. We will send you a link.   If you have any questions or concerns, please call the office at [phone].  ACTIVITY No restrictions. DO wear your seatbelt, even if it crosses over the site.   WOUND CARE  Keep the wound area clean and dry.  Remove the dressing the day after (usually 24 hours after the procedure).  DO NOT SUBMERGE UNDER WATER UNTIL FULLY HEALED (no tub baths, hot tubs, swimming pools, etc.).   You  may shower or take a sponge bath after the dressing is removed. DO NOT SOAK the area and do not allow the shower to directly spray on the site.  If you have tape/steri-strips on your wound, these will fall off; do not pull them off prematurely.    No bandage is needed on the site.  DO  NOT apply any creams, oils, or ointments to the wound area.  If you notice any drainage or discharge from the wound, any swelling, excessive redness or bruising at the site, or if you develop a fever > 101? F, call the office at once at [phone].

## 2021-05-04 NOTE — Telephone Encounter (Signed)
Faxed updated forms to [employer]

## 2021-05-04 NOTE — Progress Notes (Signed)
LOOP RECORDER IMPLANT   Procedure report  Procedure performed:  Loop recorder implantation   Reason for procedure:  1. Unexplained syncope Procedure performed by:  Sanda Klein, MD  Complications:  None  Estimated blood loss:  <5 mL  Medications administered during procedure:  Lidocaine 1% with 1/100,000 epinephrine 10 mL locally Device details:  Medtronic Reveal Linq model number [id number], serial number [id number] Procedure details:  After the risks and benefits of the procedure were discussed the patient provided informed consent. The patient was prepped and draped in usual sterile fashion. Local anesthesia was administered to an area 2 cm to the left of the sternum in the 4th intercostal space. A cutaneous incision was made using the incision tool. The introducer was then used to create a subcutaneous tunnel and carefully deploy the device. Local pressure was held to ensure hemostasis.  The incision was closed with SteriStrips and a sterile dressing was applied.  R waves 0.25 mV.  Sanda Klein, MD, [facility] [phone] office [phone] pager 05/04/2021 1:42 PM
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
## 2021-05-08 ENCOUNTER — Telehealth (HOSPITAL_COMMUNITY): Payer: Self-pay | Admitting: Emergency Medicine

## 2021-05-08 NOTE — Telephone Encounter (Signed)
IMPLANT : medtronic Reveal Linq Loop recorder  Pt returning phone call regarding upcoming cardiac imaging study; pt verbalizes understanding of appt date/time, parking situation and where to check in, and verified current allergies; name and call back number provided for further questions should they arise Jonathon Bond RN Navigator Cardiac Imaging [facility] [phone] office [phone] cell  Denies other implants, denies claustro Jonathon Fischer

## 2021-05-08 NOTE — Telephone Encounter (Signed)
Attempted to call patient regarding upcoming cardiac CT appointment. °Left message on voicemail with name and callback number °Ovida Delagarza RN Navigator Cardiac Imaging °[facility] °[phone] Office °[phone] Cell ° °

## 2021-05-12 ENCOUNTER — Ambulatory Visit (HOSPITAL_COMMUNITY)
Admission: RE | Admit: 2021-05-12 | Discharge: 2021-05-12 | Disposition: A | Discharge status: Home / Self Care | Payer: BC Managed Care – PPO | Source: Ambulatory Visit | Attending: Cardiology | Admitting: Cardiology

## 2021-05-12 ENCOUNTER — Other Ambulatory Visit: Payer: Self-pay

## 2021-05-12 ENCOUNTER — Telehealth: Payer: Self-pay | Admitting: Licensed Clinical Social Worker

## 2021-05-12 DIAGNOSIS — R55 Syncope and collapse: Secondary | ICD-10-CM | POA: Diagnosis not present

## 2021-05-12 DIAGNOSIS — I493 Ventricular premature depolarization: Secondary | ICD-10-CM

## 2021-05-12 MED ORDER — GADOBUTROL 1 MMOL/ML IV SOLN
10.0000 mL | Freq: Once | INTRAVENOUS | Status: AC | PRN
  Administered 2021-05-12: 10 mL via INTRAVENOUS

## 2021-05-12 NOTE — Telephone Encounter (Signed)
Pt called this Probation officer, [phone]), he shares that he spoke with his HR department and he is eligible for COBRA but cannot afford the copay for the extension of coverage. LCSW shared that I could meet with him to go over the potential programs he may be eligible for ([employer], [employer], [employer]). Pt agreeable to come to office tomorrow at 3:00pm.   Westley Hummer, MSW, [place]  [phone]

## 2021-05-12 NOTE — Telephone Encounter (Signed)
Complete

## 2021-05-13 ENCOUNTER — Telehealth: Payer: Self-pay | Admitting: Licensed Clinical Social Worker

## 2021-05-13 NOTE — Telephone Encounter (Signed)
LCSW called this Probation officer again this morning. He states that he now does not want to come meet with this writer to go over assistance applications- unclear why at this point pt now not interested. LCSW offered to still send blank applications with notes as to how to complete and my card should pt change his mind. Pt agreeable to this, confirmed w/ him again his address.   LCSW has mailed the following: [employer] application, [place], Medicaid screening/DSS information, and information about private insurance through [employer]. Remain available as needed for pt questions/concerns- I also shared the above conversation with Angie Fava, RN.   Jonathon Fischer, MSW, [place]  [phone]

## 2021-05-18 ENCOUNTER — Telehealth (INDEPENDENT_AMBULATORY_CARE_PROVIDER_SITE_OTHER): Payer: BC Managed Care – PPO | Admitting: Cardiovascular Disease

## 2021-05-18 ENCOUNTER — Other Ambulatory Visit: Payer: Self-pay

## 2021-05-18 ENCOUNTER — Other Ambulatory Visit (INDEPENDENT_AMBULATORY_CARE_PROVIDER_SITE_OTHER): Payer: Self-pay

## 2021-05-18 DIAGNOSIS — R7989 Other specified abnormal findings of blood chemistry: Secondary | ICD-10-CM | POA: Diagnosis not present

## 2021-05-18 DIAGNOSIS — R55 Syncope and collapse: Secondary | ICD-10-CM

## 2021-05-18 DIAGNOSIS — Z95818 Presence of other cardiac implants and grafts: Secondary | ICD-10-CM

## 2021-05-18 NOTE — Addendum Note (Signed)
Addended by: Eddie North C on: 05/18/2021 01:11 PM   Modules accepted: Orders

## 2021-05-19 LAB — HEPATIC FUNCTION PANEL
ALT: 23 U/L (ref 0–53)
AST: 22 U/L (ref 0–37)
Albumin: 4.4 g/dL (ref 3.5–5.2)
Alkaline Phosphatase: 33 U/L — ABNORMAL LOW (ref 39–117)
Bilirubin, Direct: 0.1 mg/dL (ref 0.0–0.3)
Total Bilirubin: 0.4 mg/dL (ref 0.2–1.2)
Total Protein: 6.5 g/dL (ref 6.0–8.3)

## 2021-05-19 NOTE — Progress Notes (Signed)
The patient presented to the clinic for a wound check after implantable loop recorder placement about 2 weeks ago. He has no complaints related to the implantation site, which is well-healed, without redness, swelling, draining or bleeding.  Unfortunately he has developed a little bit of folliculitis after we shaved his chest in order to perform the procedure.  This seems to also be improving at this time.  Enrolled in device clinic with remote loop recorder downloads monthly.  Office visit yearly.

## 2021-06-08 ENCOUNTER — Ambulatory Visit (INDEPENDENT_AMBULATORY_CARE_PROVIDER_SITE_OTHER): Payer: BC Managed Care – PPO

## 2021-06-08 DIAGNOSIS — R55 Syncope and collapse: Secondary | ICD-10-CM

## 2021-06-09 LAB — CUP PACEART REMOTE DEVICE CHECK
Date Time Interrogation Session: 20220627210839
Implantable Pulse Generator Implant Date: 20220523

## 2021-06-24 NOTE — Progress Notes (Signed)
Carelink Summary Report / Loop Recorder 

## 2021-07-13 ENCOUNTER — Ambulatory Visit (INDEPENDENT_AMBULATORY_CARE_PROVIDER_SITE_OTHER): Payer: BC Managed Care – PPO

## 2021-07-13 DIAGNOSIS — R55 Syncope and collapse: Secondary | ICD-10-CM

## 2021-07-13 LAB — CUP PACEART REMOTE DEVICE CHECK
Date Time Interrogation Session: 20220730211144
Implantable Pulse Generator Implant Date: 20220523

## 2021-07-30 ENCOUNTER — Ambulatory Visit (INDEPENDENT_AMBULATORY_CARE_PROVIDER_SITE_OTHER): Payer: Self-pay | Admitting: Family Medicine

## 2021-07-30 ENCOUNTER — Encounter: Payer: Self-pay | Admitting: Family Medicine

## 2021-07-30 ENCOUNTER — Other Ambulatory Visit: Payer: Self-pay

## 2021-07-30 VITALS — BP 132/78 | HR 70 | Temp 98.3°F | Resp 16 | Ht 71.0 in | Wt 238.2 lb

## 2021-07-30 DIAGNOSIS — G4733 Obstructive sleep apnea (adult) (pediatric): Secondary | ICD-10-CM

## 2021-07-30 DIAGNOSIS — I10 Essential (primary) hypertension: Secondary | ICD-10-CM

## 2021-07-30 DIAGNOSIS — E782 Mixed hyperlipidemia: Secondary | ICD-10-CM

## 2021-07-30 DIAGNOSIS — Z0000 Encounter for general adult medical examination without abnormal findings: Secondary | ICD-10-CM

## 2021-07-30 DIAGNOSIS — Z125 Encounter for screening for malignant neoplasm of prostate: Secondary | ICD-10-CM

## 2021-07-30 DIAGNOSIS — Z131 Encounter for screening for diabetes mellitus: Secondary | ICD-10-CM

## 2021-07-30 DIAGNOSIS — Z130 Encounter for screening for diseases of the blood and blood-forming organs and certain disorders involving the immune mechanism: Secondary | ICD-10-CM

## 2021-07-30 LAB — LIPID PANEL
Cholesterol: 167 mg/dL (ref 0–200)
HDL: 71.1 mg/dL (ref >39.00)
LDL Cholesterol: 74 mg/dL (ref 0–99)
NonHDL: 95.48
Total CHOL/HDL Ratio: 2
Triglycerides: 107 mg/dL (ref 0.0–149.0)
VLDL: 21.4 mg/dL (ref 0.0–40.0)

## 2021-07-30 LAB — PSA: PSA: 2.04 ng/mL (ref 0.10–4.00)

## 2021-07-30 MED ORDER — HYDROCHLOROTHIAZIDE 25 MG PO TABS: 25.0000 mg | ORAL_TABLET | Freq: Every day | ORAL | 1 refills | Status: DC

## 2021-07-30 MED ORDER — LISINOPRIL 40 MG PO TABS: 40.0000 mg | ORAL_TABLET | Freq: Every day | ORAL | 1 refills | Status: DC

## 2021-07-30 NOTE — Patient Instructions (Addendum)
I sent message to your cardiologist about the cpap for sleep apnea, but would discuss heart rhythm and prior symptoms with cardiology at upcoming appointment.  Great work on the weigh loss!  Keep up the good work.  When you are able, can send in the cologuard kit for colon cancer screening. I can send a new one if that is expired.   Preventive Care 72-59 Years Old, Male Preventive care refers to lifestyle choices and visits with your health care provider that can promote health and wellness. This includes: A yearly physical exam. This is also called an annual wellness visit. Regular dental and eye exams. Immunizations. Screening for certain conditions. Healthy lifestyle choices, such as: Eating a healthy diet. Getting regular exercise. Not using drugs or products that contain nicotine and tobacco. Limiting alcohol use. What can I expect for my preventive care visit? Physical exam Your health care provider will check your: Height and weight. These may be used to calculate your BMI (body mass index). BMI is a measurement that tells if you are at a healthy weight. Heart rate and blood pressure. Body temperature. Skin for abnormal spots. Counseling Your health care provider may ask you questions about your: Past medical problems. Family's medical history. Alcohol, tobacco, and drug use. Emotional well-being. Home life and relationship well-being. Sexual activity. Diet, exercise, and sleep habits. Work and work Statistician. Access to firearms. What immunizations do I need?  Vaccines are usually given at various ages, according to a schedule. Your health care provider will recommend vaccines for you based on your age, medicalhistory, and lifestyle or other factors, such as travel or where you work. What tests do I need? Blood tests Lipid and cholesterol levels. These may be checked every 5 years, or more often if you are over 57 years old. Hepatitis C test. Hepatitis B  test. Screening Lung cancer screening. You may have this screening every year starting at age 31 if you have a 30-pack-year history of smoking and currently smoke or have quit within the past 15 years. Prostate cancer screening. Recommendations will vary depending on your family history and other risks. Genital exam to check for testicular cancer or hernias. Colorectal cancer screening. All adults should have this screening starting at age 32 and continuing until age 14. Your health care provider may recommend screening at age 72 if you are at increased risk. You will have tests every 1-10 years, depending on your results and the type of screening test. Diabetes screening. This is done by checking your blood sugar (glucose) after you have not eaten for a while (fasting). You may have this done every 1-3 years. STD (sexually transmitted disease) testing, if you are at risk. Follow these instructions at home: Eating and drinking  Eat a diet that includes fresh fruits and vegetables, whole grains, lean protein, and low-fat dairy products. Take vitamin and mineral supplements as recommended by your health care provider. Do not drink alcohol if your health care provider tells you not to drink. If you drink alcohol: Limit how much you have to 0-2 drinks a day. Be aware of how much alcohol is in your drink. In the [place] one drink equals one 12 oz bottle of beer (355 mL), one 5 oz glass of wine (148 mL), or one 1 oz glass of hard liquor (44 mL).  Lifestyle Take daily care of your teeth and gums. Brush your teeth every morning and night with fluoride toothpaste. Floss one time each day. Stay active. Exercise for at  least 30 minutes 5 or more days each week. Do not use any products that contain nicotine or tobacco, such as cigarettes, e-cigarettes, and chewing tobacco. If you need help quitting, ask your health care provider. Do not use drugs. If you are sexually active, practice safe sex. Use a  condom or other form of protection to prevent STIs (sexually transmitted infections). If told by your health care provider, take low-dose aspirin daily starting at age 33. Find healthy ways to cope with stress, such as: Meditation, yoga, or listening to music. Journaling. Talking to a trusted person. Spending time with friends and family. Safety Always wear your seat belt while driving or riding in a vehicle. Do not drive: If you have been drinking alcohol. Do not ride with someone who has been drinking. When you are tired or distracted. While texting. Wear a helmet and other protective equipment during sports activities. If you have firearms in your house, make sure you follow all gun safety procedures. What's next? Go to your health care provider once a year for an annual wellness visit. Ask your health care provider how often you should have your eyes and teeth checked. Stay up to date on all vaccines. This information is not intended to replace advice given to you by your health care provider. Make sure you discuss any questions you have with your healthcare provider. Document Revised: 08/28/2019 Document Reviewed: 11/23/2018 Elsevier Patient Education  2022 [employer].
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
## 2021-08-04 ENCOUNTER — Encounter: Payer: Self-pay | Admitting: Cardiology

## 2021-08-04 ENCOUNTER — Ambulatory Visit (INDEPENDENT_AMBULATORY_CARE_PROVIDER_SITE_OTHER): Payer: Self-pay | Admitting: Cardiology

## 2021-08-04 ENCOUNTER — Other Ambulatory Visit: Payer: Self-pay

## 2021-08-04 VITALS — BP 124/68 | HR 63 | Ht 71.5 in | Wt 236.8 lb

## 2021-08-04 DIAGNOSIS — G4733 Obstructive sleep apnea (adult) (pediatric): Secondary | ICD-10-CM

## 2021-08-04 DIAGNOSIS — R55 Syncope and collapse: Secondary | ICD-10-CM

## 2021-08-04 DIAGNOSIS — I493 Ventricular premature depolarization: Secondary | ICD-10-CM

## 2021-08-04 DIAGNOSIS — R079 Chest pain, unspecified: Secondary | ICD-10-CM

## 2021-08-04 DIAGNOSIS — I10 Essential (primary) hypertension: Secondary | ICD-10-CM

## 2021-08-04 NOTE — Patient Instructions (Signed)
Medication Instructions:  Your physician recommends that you continue on your current medications as directed. Please refer to the Current Medication list given to you today.  *If you need a refill on your cardiac medications before your next appointment, please call your pharmacy*  Testing/Procedures: CPAP titration-we will call to schedule   Follow-Up: At [facility], you and your health needs are our priority.  As part of our continuing mission to provide you with exceptional heart care, we have created designated Provider Care Teams.  These Care Teams include your primary Cardiologist (physician) and Advanced Practice Providers (APPs -  Physician Assistants and Nurse Practitioners) who all work together to provide you with the care you need, when you need it.  We recommend signing up for the patient portal called "MyChart".  Sign up information is provided on this After Visit Summary.  MyChart is used to connect with patients for Virtual Visits (Telemedicine).  Patients are able to view lab/test results, encounter notes, upcoming appointments, etc.  Non-urgent messages can be sent to your provider as well.   To learn more about what you can do with MyChart, go to [email or web address].    Your next appointment:   6 month(s)  The format for your next appointment:   In Person  Provider:   Oswaldo Milian, MD
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
## 2021-08-05 NOTE — Progress Notes (Signed)
Carelink Summary Report / Loop Recorder 

## 2021-08-07 ENCOUNTER — Other Ambulatory Visit: Payer: Self-pay | Admitting: Cardiovascular Disease

## 2021-08-07 ENCOUNTER — Telehealth: Payer: Self-pay | Admitting: *Deleted

## 2021-08-07 DIAGNOSIS — G4733 Obstructive sleep apnea (adult) (pediatric): Secondary | ICD-10-CM

## 2021-08-07 DIAGNOSIS — I10 Essential (primary) hypertension: Secondary | ICD-10-CM

## 2021-08-07 DIAGNOSIS — G4736 Sleep related hypoventilation in conditions classified elsewhere: Secondary | ICD-10-CM

## 2021-08-07 NOTE — Telephone Encounter (Signed)
Called the patient and discussed his HST results and recommendations. He states that he does not currently have any insurance. He is "burried " $10,000 in medical bills and cannot afford to have the titration done. I explained to him the effects of untreated sleep apnea. He asked for the contact information for [facility] sleep lab to ask financial questions. If he decides to to the study he will schedule it when he calls them.

## 2021-08-18 ENCOUNTER — Ambulatory Visit (INDEPENDENT_AMBULATORY_CARE_PROVIDER_SITE_OTHER): Payer: BC Managed Care – PPO

## 2021-08-18 DIAGNOSIS — R55 Syncope and collapse: Secondary | ICD-10-CM

## 2021-08-18 LAB — CUP PACEART REMOTE DEVICE CHECK
Date Time Interrogation Session: 20220901210718
Implantable Pulse Generator Implant Date: 20220523

## 2021-08-26 NOTE — Progress Notes (Signed)
Carelink Summary Report / Loop Recorder 

## 2021-09-17 LAB — CUP PACEART REMOTE DEVICE CHECK
Date Time Interrogation Session: 20221004210738
Implantable Pulse Generator Implant Date: 20220523

## 2021-09-21 ENCOUNTER — Ambulatory Visit (INDEPENDENT_AMBULATORY_CARE_PROVIDER_SITE_OTHER): Payer: BC Managed Care – PPO

## 2021-09-21 DIAGNOSIS — R55 Syncope and collapse: Secondary | ICD-10-CM

## 2021-09-26 ENCOUNTER — Other Ambulatory Visit: Payer: Self-pay | Admitting: Family Medicine

## 2021-09-26 DIAGNOSIS — I10 Essential (primary) hypertension: Secondary | ICD-10-CM

## 2021-09-29 NOTE — Progress Notes (Signed)
Carelink Summary Report / Loop Recorder 

## 2021-10-20 LAB — CUP PACEART REMOTE DEVICE CHECK
Date Time Interrogation Session: 20221106211150
Implantable Pulse Generator Implant Date: 20220523

## 2021-10-26 ENCOUNTER — Ambulatory Visit (INDEPENDENT_AMBULATORY_CARE_PROVIDER_SITE_OTHER): Payer: BC Managed Care – PPO

## 2021-10-26 DIAGNOSIS — R55 Syncope and collapse: Secondary | ICD-10-CM

## 2021-10-28 ENCOUNTER — Telehealth: Payer: Self-pay | Admitting: *Deleted

## 2021-10-28 NOTE — Telephone Encounter (Signed)
Attempted to contact patient. No answer,LMTCB 

## 2021-10-28 NOTE — Telephone Encounter (Signed)
Received notification patient walked in requesting to speak to someone in regards to monthly transmissions from his loop recorder.   He is concerned due to cost of this monthly.  Reports he unplugged the monitor until further instructions.    Routed to device clinic to follow up.

## 2021-10-30 NOTE — Telephone Encounter (Signed)
LVM for patient to call device clinic back.

## 2021-11-02 NOTE — Telephone Encounter (Signed)
3rd attempt to contact patient regarding patients questions about monthly transmissions and his home monitor.

## 2021-11-02 NOTE — Progress Notes (Signed)
Carelink Summary Report / Loop Recorder 

## 2021-11-04 NOTE — Telephone Encounter (Signed)
I spoke with the patient and he let me know that he do not have insurance. He can not afford the monthly bill. I told him I will ask Dr. Sallyanne Kuster can he come into the office every so often to get his device check since he can not afford the monthly bill. I told him I will call him back as soon as Dr, Sallyanne Kuster respond. The patient verbalized understanding and thanked me for my help.

## 2021-11-11 ENCOUNTER — Telehealth: Payer: Self-pay | Admitting: Licensed Clinical Social Worker

## 2021-11-11 NOTE — Telephone Encounter (Signed)
LCSW attempted to reach pt regarding challenges w/ insurance and finances.  No answer on cell ([phone]), left message requesting call back.  Previously had engaged pt about assistance applications however was not interested at that time.  Pt may be eligible for financial assistance through [employer] and [employer]. He would be able to receive his medications through [facility] if willing to complete those applications.   Westley Hummer, MSW, [place]  [phone]- work cell phone (preferred) [phone]- desk phone

## 2021-11-11 NOTE — Telephone Encounter (Signed)
Jonathon Fischer, can we touch base with Jonathon Fischer- did he apply for Medicaid?

## 2021-11-12 NOTE — Telephone Encounter (Signed)
Able to touch base with pt this afternoon at [phone].  Pt shares that he is currently working, just on probationary period where his benefits have not kicked in yet. He is earning income and doesn't think he would qualify for the assistance applications offered. He still has the previous paperwork sent to him.   He is continuing to take his medications and once his coverage restarts plans to continue his care. He just had concern about potential bills piling up while he waits for insurance coverage since they would not backpay. LCSW encouraged him to consider at least applying for the programs offered; since even if there is not 100% assistance, sometimes they can offer assistance for ongoing bills in different amounts (I.e. 50%/75% coverage based on income). Pt declines to complete any assistance at this time, but he has my number if he reconsiders and would like help completing them. I will f/u with pt in a month to see if he has received any coverage/has reconsidered completing them in the mean time.   Westley Hummer, MSW, [place]  [phone]- work cell phone (preferred) [phone]- desk phone

## 2021-11-16 NOTE — Telephone Encounter (Signed)
Have attempted to contact patient 3 times by phone, letter sent to patient regardingDr.Croitoru's recommendations to send download every 3 months and to send a transmission if he should pass out and to call device clinic.

## 2021-12-21 ENCOUNTER — Telehealth: Payer: Self-pay | Admitting: Licensed Clinical Social Worker

## 2021-12-21 NOTE — Telephone Encounter (Signed)
LCSW reached out to pt this morning to f/u on insurance coverage. Per our last conversation pt was anticipating having coverage in the new year. If for some reason this has not materialized then we can assist with [employer]  if pt interested- previously declined assistance.   Westley Hummer, MSW, [place]  [phone]- work cell phone (preferred) [phone]- desk phone

## 2022-02-01 ENCOUNTER — Ambulatory Visit (INDEPENDENT_AMBULATORY_CARE_PROVIDER_SITE_OTHER): Payer: BC Managed Care – PPO | Admitting: Family Medicine

## 2022-02-01 VITALS — BP 138/80 | HR 58 | Temp 98.2°F | Resp 16 | Ht 71.5 in | Wt 233.2 lb

## 2022-02-01 DIAGNOSIS — R55 Syncope and collapse: Secondary | ICD-10-CM

## 2022-02-01 DIAGNOSIS — G4733 Obstructive sleep apnea (adult) (pediatric): Secondary | ICD-10-CM | POA: Diagnosis not present

## 2022-02-01 DIAGNOSIS — Z1211 Encounter for screening for malignant neoplasm of colon: Secondary | ICD-10-CM

## 2022-02-01 DIAGNOSIS — I10 Essential (primary) hypertension: Secondary | ICD-10-CM | POA: Diagnosis not present

## 2022-02-01 DIAGNOSIS — H6121 Impacted cerumen, right ear: Secondary | ICD-10-CM

## 2022-02-01 LAB — BASIC METABOLIC PANEL
BUN: 28 mg/dL — ABNORMAL HIGH (ref 6–23)
CO2: 32 mEq/L (ref 19–32)
Calcium: 9.4 mg/dL (ref 8.4–10.5)
Chloride: 100 mEq/L (ref 96–112)
Creatinine, Ser: 0.73 mg/dL (ref 0.40–1.50)
GFR: 99.82 mL/min (ref >60.00)
Glucose, Bld: 101 mg/dL — ABNORMAL HIGH (ref 70–99)
Potassium: 4.5 mEq/L (ref 3.5–5.1)
Sodium: 135 mEq/L (ref 135–145)

## 2022-02-01 MED ORDER — HYDROCHLOROTHIAZIDE 25 MG PO TABS: 25.0000 mg | ORAL_TABLET | Freq: Every day | ORAL | 2 refills | Status: DC

## 2022-02-01 MED ORDER — LISINOPRIL 40 MG PO TABS: 40.0000 mg | ORAL_TABLET | Freq: Every day | ORAL | 2 refills | Status: DC
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
## 2022-02-01 NOTE — Patient Instructions (Addendum)
I would still recommend starting CPAP, or following up with sleep specialist to review treatment options.  No med changes for now. Keep up the good work with exercise and weight loss. Cutting back some on beer may help as well.  Let me know if you would like to meet with specialist for leg pain or follow up to discuss further if needed.  Shingrix vaccine can be given at your pharmacy.  Please send in Cologuard but let me know if there are questions - it may be helpful to call the number on the kit to make sure it is still ok to send and can discuss potential cost of that test with insurance.  Thanks for coming in today.   Earwax Buildup, Adult The ears produce a substance called earwax that helps keep bacteria out of the ear and protects the skin in the ear canal. Occasionally, earwax can build up in the ear and cause discomfort or hearing loss. What are the causes? This condition is caused by a buildup of earwax. Ear canals are self-cleaning. Ear wax is made in the outer part of the ear canal and generally falls out in small amounts over time. When the self-cleaning mechanism is not working, earwax builds up and can cause decreased hearing and discomfort. Attempting to clean ears with cotton swabs can push the earwax deep into the ear canal and cause decreased hearing and pain. What increases the risk? This condition is more likely to develop in people who: Clean their ears often with cotton swabs. Pick at their ears. Use earplugs or in-ear headphones often, or wear hearing aids. The following factors may also make you more likely to develop this condition: Being male. Being of older age. Naturally producing more earwax. Having narrow ear canals. Having earwax that is overly thick or sticky. Having excess hair in the ear canal. Having eczema. Being dehydrated. What are the signs or symptoms? Symptoms of this condition include: Reduced or muffled hearing. A feeling of fullness in the ear  or feeling that the ear is plugged. Fluid coming from the ear. Ear pain or an itchy ear. Ringing in the ear. Coughing. Balance problems. An obvious piece of earwax that can be seen inside the ear canal. How is this diagnosed? This condition may be diagnosed based on: Your symptoms. Your medical history. An ear exam. During the exam, your health care provider will look into your ear with an instrument called an otoscope. You may have tests, including a hearing test. How is this treated? This condition may be treated by: Using ear drops to soften the earwax. Having the earwax removed by a health care provider. The health care provider may: Flush the ear with water. Use an instrument that has a loop on the end (curette). Use a suction device. Having surgery to remove the wax buildup. This may be done in severe cases. Follow these instructions at home:  Take over-the-counter and prescription medicines only as told by your health care provider. Do not put any objects, including cotton swabs, into your ear. You can clean the opening of your ear canal with a washcloth or facial tissue. Follow instructions from your health care provider about cleaning your ears. Do not overclean your ears. Drink enough fluid to keep your urine pale yellow. This will help to thin the earwax. Keep all follow-up visits as told. If earwax builds up in your ears often or if you use hearing aids, consider seeing your health care provider for routine, preventive  ear cleanings. Ask your health care provider how often you should schedule your cleanings. If you have hearing aids, clean them according to instructions from the manufacturer and your health care provider. Contact a health care provider if: You have ear pain. You develop a fever. You have pus or other fluid coming from your ear. You have hearing loss. You have ringing in your ears that does not go away. You feel like the room is spinning (vertigo). Your  symptoms do not improve with treatment. Get help right away if: You have bleeding from the affected ear. You have severe ear pain. Summary Earwax can build up in the ear and cause discomfort or hearing loss. The most common symptoms of this condition include reduced or muffled hearing, a feeling of fullness in the ear, or feeling that the ear is plugged. This condition may be diagnosed based on your symptoms, your medical history, and an ear exam. This condition may be treated by using ear drops to soften the earwax or by having the earwax removed by a health care provider. Do not put any objects, including cotton swabs, into your ear. You can clean the opening of your ear canal with a washcloth or facial tissue. This information is not intended to replace advice given to you by your health care provider. Make sure you discuss any questions you have with your health care provider. Document Revised: 03/18/2020 Document Reviewed: 03/18/2020 Elsevier Patient Education  [place].

## 2022-02-02 ENCOUNTER — Encounter: Payer: Self-pay | Admitting: Family Medicine

## 2022-02-03 ENCOUNTER — Ambulatory Visit: Payer: Self-pay | Admitting: Cardiology

## 2022-02-08 ENCOUNTER — Ambulatory Visit (INDEPENDENT_AMBULATORY_CARE_PROVIDER_SITE_OTHER): Payer: BC Managed Care – PPO

## 2022-02-08 DIAGNOSIS — R55 Syncope and collapse: Secondary | ICD-10-CM | POA: Diagnosis not present

## 2022-02-08 LAB — CUP PACEART REMOTE DEVICE CHECK
Date Time Interrogation Session: 20230226230931
Implantable Pulse Generator Implant Date: 20220523

## 2022-02-11 NOTE — Progress Notes (Signed)
Carelink Summary Report / Loop Recorder 

## 2022-05-25 ENCOUNTER — Telehealth: Payer: Self-pay | Admitting: Family Medicine

## 2022-05-25 NOTE — Telephone Encounter (Signed)
Pt stats that he has been really  light headed due his blood pressure medication he is taking (hydrochlorothiazide 25 mg). PT wants to know if he can get his medication dosage reduced.

## 2022-05-26 NOTE — Telephone Encounter (Signed)
Is he checking his blood pressure readings at home?  If he is lightheaded and low blood pressures, we should decrease dose until he can be seen in office.  Can split to one half HCTZ for now, and office visit within the next 1 week if possible.  If any worsening lightheadedness or dizziness should be seen right away.

## 2022-05-26 NOTE — Telephone Encounter (Signed)
Reduce medication or should I make pt an appointment given symptoms

## 2022-05-26 NOTE — Telephone Encounter (Signed)
I accidentally routed back to [place], I meant to ask you if pt should simply reduce med given symptoms or if he should make an appointment

## 2022-05-27 NOTE — Telephone Encounter (Signed)
Ok. 1/2 pill ok, but check BP at pharmacyetc and send me readings. Appt in next 1 week - ok to use acute slot if needed.

## 2022-05-27 NOTE — Telephone Encounter (Signed)
Pt does not have monitor at home

## 2022-05-28 NOTE — Telephone Encounter (Signed)
Pt has been made an appt, and will try to check BP at pharmacy

## 2022-06-02 ENCOUNTER — Ambulatory Visit: Payer: BC Managed Care – PPO | Admitting: Family Medicine

## 2022-06-30 ENCOUNTER — Ambulatory Visit: Payer: BC Managed Care – PPO | Admitting: Family Medicine

## 2022-07-05 ENCOUNTER — Ambulatory Visit (INDEPENDENT_AMBULATORY_CARE_PROVIDER_SITE_OTHER): Payer: BC Managed Care – PPO | Admitting: Family Medicine

## 2022-07-05 ENCOUNTER — Encounter: Payer: Self-pay | Admitting: Family Medicine

## 2022-07-05 VITALS — BP 118/68 | HR 78 | Temp 98.6°F | Resp 16 | Ht 71.5 in | Wt 218.8 lb

## 2022-07-05 DIAGNOSIS — I10 Essential (primary) hypertension: Secondary | ICD-10-CM | POA: Diagnosis not present

## 2022-07-05 DIAGNOSIS — R739 Hyperglycemia, unspecified: Secondary | ICD-10-CM | POA: Diagnosis not present

## 2022-07-05 DIAGNOSIS — G4733 Obstructive sleep apnea (adult) (pediatric): Secondary | ICD-10-CM

## 2022-07-05 DIAGNOSIS — R42 Dizziness and giddiness: Secondary | ICD-10-CM | POA: Diagnosis not present

## 2022-07-05 DIAGNOSIS — Z87898 Personal history of other specified conditions: Secondary | ICD-10-CM

## 2022-07-05 MED ORDER — LISINOPRIL 40 MG PO TABS: 40.0000 mg | ORAL_TABLET | Freq: Every day | ORAL | 2 refills | Status: DC

## 2022-07-05 MED ORDER — LISINOPRIL 20 MG PO TABS: 20.0000 mg | ORAL_TABLET | Freq: Every day | ORAL | 1 refills | Status: DC
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
## 2022-07-05 NOTE — Patient Instructions (Addendum)
With improvement on stopping hydrochlorothiazide, I suspect some of the lightheadedness was due to overtreated blood pressure.  I will check some labs including anemia test, kidney, liver test, electrolytes, thyroid test but suspect those will be okay.  Make sure you are continuing to drink plenty of fluids.    Based on today's blood pressure we may be able to decrease lisinopril to 20 mg/day as long as you are monitoring blood pressure and if that starts to go above 140/90 return to 40 mg/day.  Continue to drink plenty of fluids throughout the day including electrolyte replacement as needed.  If lightheadedness is not improving with these changes, I would recommend follow-up with cardiology to decide if other monitoring needed.  Return to the clinic or go to the nearest emergency room if any of your symptoms worsen or new symptoms occur.  Return to the clinic or go to the nearest emergency room if any of your symptoms worsen or new symptoms occur.

## 2022-07-06 LAB — COMPREHENSIVE METABOLIC PANEL
ALT: 244 U/L — ABNORMAL HIGH (ref 0–53)
AST: 153 U/L — ABNORMAL HIGH (ref 0–37)
Albumin: 3.8 g/dL (ref 3.5–5.2)
Alkaline Phosphatase: 678 U/L — ABNORMAL HIGH (ref 39–117)
BUN: 15 mg/dL (ref 6–23)
CO2: 26 mEq/L (ref 19–32)
Calcium: 9.1 mg/dL (ref 8.4–10.5)
Chloride: 98 mEq/L (ref 96–112)
Creatinine, Ser: 0.76 mg/dL (ref 0.40–1.50)
GFR: 98.32 mL/min (ref >60.00)
Glucose, Bld: 89 mg/dL (ref 70–99)
Potassium: 4.8 mEq/L (ref 3.5–5.1)
Sodium: 132 mEq/L — ABNORMAL LOW (ref 135–145)
Total Bilirubin: 3.7 mg/dL — ABNORMAL HIGH (ref 0.2–1.2)
Total Protein: 6.8 g/dL (ref 6.0–8.3)

## 2022-07-06 LAB — CBC WITH DIFFERENTIAL/PLATELET
Basophils Absolute: 0.1 10*3/uL (ref 0.0–0.1)
Basophils Relative: 1 % (ref 0.0–3.0)
Eosinophils Absolute: 0 10*3/uL (ref 0.0–0.7)
Eosinophils Relative: 0.5 % (ref 0.0–5.0)
HCT: 38.2 % — ABNORMAL LOW (ref 39.0–52.0)
Hemoglobin: 12.8 g/dL — ABNORMAL LOW (ref 13.0–17.0)
Lymphocytes Relative: 10.1 % — ABNORMAL LOW (ref 12.0–46.0)
Lymphs Abs: 1.1 10*3/uL (ref 0.7–4.0)
MCHC: 33.5 g/dL (ref 30.0–36.0)
MCV: 90.6 fl (ref 78.0–100.0)
Monocytes Absolute: 1 10*3/uL (ref 0.1–1.0)
Monocytes Relative: 9.6 % (ref 3.0–12.0)
Neutro Abs: 8.5 10*3/uL — ABNORMAL HIGH (ref 1.4–7.7)
Neutrophils Relative %: 78.8 % — ABNORMAL HIGH (ref 43.0–77.0)
Platelets: 318 10*3/uL (ref 150.0–400.0)
RBC: 4.22 Mil/uL (ref 4.22–5.81)
RDW: 14 % (ref 11.5–15.5)
WBC: 10.8 10*3/uL — ABNORMAL HIGH (ref 4.0–10.5)

## 2022-07-06 LAB — HEMOGLOBIN A1C: Hgb A1c MFr Bld: 5.2 % (ref 4.6–6.5)

## 2022-07-10 ENCOUNTER — Other Ambulatory Visit: Payer: Self-pay | Admitting: Family Medicine

## 2022-07-10 DIAGNOSIS — D649 Anemia, unspecified: Secondary | ICD-10-CM

## 2022-07-10 DIAGNOSIS — R7989 Other specified abnormal findings of blood chemistry: Secondary | ICD-10-CM

## 2022-07-10 DIAGNOSIS — E871 Hypo-osmolality and hyponatremia: Secondary | ICD-10-CM

## 2022-07-10 NOTE — Progress Notes (Signed)
See lab notes, elevated LFTs, low hemoglobin, hyponatremia, leukocytosis, borderline. Now off thiazide diuretic.  Will monitor for improvement in sodium. New increase in LFTs, reports darker urine at work, but improving over the weekend.  Asymptomatic at this time.  Also asymptomatic with borderline low hemoglobin. Repeat labs in 2 days, with hepatitis panel, possible ultrasound needed versus ER eval depending on readings, ER if any symptoms.

## 2022-07-12 ENCOUNTER — Other Ambulatory Visit (INDEPENDENT_AMBULATORY_CARE_PROVIDER_SITE_OTHER): Payer: BC Managed Care – PPO

## 2022-07-12 ENCOUNTER — Telehealth: Payer: Self-pay

## 2022-07-12 DIAGNOSIS — E871 Hypo-osmolality and hyponatremia: Secondary | ICD-10-CM

## 2022-07-12 DIAGNOSIS — D649 Anemia, unspecified: Secondary | ICD-10-CM | POA: Diagnosis not present

## 2022-07-12 DIAGNOSIS — R7989 Other specified abnormal findings of blood chemistry: Secondary | ICD-10-CM

## 2022-07-12 LAB — CBC
HCT: 36.6 % — ABNORMAL LOW (ref 39.0–52.0)
Hemoglobin: 11.9 g/dL — ABNORMAL LOW (ref 13.0–17.0)
MCHC: 32.6 g/dL (ref 30.0–36.0)
MCV: 91.4 fl (ref 78.0–100.0)
Platelets: 342 10*3/uL (ref 150.0–400.0)
RBC: 4.01 Mil/uL — ABNORMAL LOW (ref 4.22–5.81)
RDW: 14.6 % (ref 11.5–15.5)
WBC: 8.3 10*3/uL (ref 4.0–10.5)

## 2022-07-12 NOTE — Telephone Encounter (Signed)
Called pt to [facility] lab only for repeat labs per Dr Mancel Bale result note

## 2022-07-12 NOTE — Telephone Encounter (Signed)
-----   Message from Wendie Agreste, MD sent at 07/10/2022  3:32 PM EDT -----  I called patient with results.  He will need lab only visit Monday morning. Discussed elevated LFTs, borderline low hemoglobin, mild hyponatremia, mild leukocytosis. 2-3 beers per day, no recent increased use. -Advised to avoid alcohol for now. No recent tylenol.  Also advised to avoid Tylenol for now. No abd pain, nausea, vomiting, or fever. Darker urine at times during hot day at work, not today. Looks near normal - yellow today. No melena/hematochezia.  Differential includes acute hepatitis with recent malaise, improved recently.  Less likely alcohol cause.  Avoid hepatotoxins for now, option in ER visit but deferred and will have lab only visit Monday morning.  Repeat LFTs at that time along with acute hepatitis panel, repeat CBC.  Patient is aware to be seen in the ER if any abdominal pain, nausea, vomiting, yellowing of the skin of the eyes, fever or other new symptoms.

## 2022-07-13 LAB — COMPREHENSIVE METABOLIC PANEL
ALT: 105 U/L — ABNORMAL HIGH (ref 0–53)
AST: 48 U/L — ABNORMAL HIGH (ref 0–37)
Albumin: 3.6 g/dL (ref 3.5–5.2)
Alkaline Phosphatase: 503 U/L — ABNORMAL HIGH (ref 39–117)
BUN: 21 mg/dL (ref 6–23)
CO2: 27 mEq/L (ref 19–32)
Calcium: 9.3 mg/dL (ref 8.4–10.5)
Chloride: 103 mEq/L (ref 96–112)
Creatinine, Ser: 0.66 mg/dL (ref 0.40–1.50)
GFR: 102.58 mL/min (ref >60.00)
Glucose, Bld: 91 mg/dL (ref 70–99)
Potassium: 5.3 mEq/L — ABNORMAL HIGH (ref 3.5–5.1)
Sodium: 137 mEq/L (ref 135–145)
Total Bilirubin: 2.1 mg/dL — ABNORMAL HIGH (ref 0.2–1.2)
Total Protein: 6.7 g/dL (ref 6.0–8.3)

## 2022-07-13 LAB — HEPATITIS PANEL, ACUTE
Hep A IgM: NONREACTIVE
Hep B C IgM: NONREACTIVE
Hepatitis B Surface Ag: NONREACTIVE
Hepatitis C Ab: NONREACTIVE

## 2022-07-14 ENCOUNTER — Other Ambulatory Visit: Payer: Self-pay | Admitting: Family Medicine

## 2022-07-14 DIAGNOSIS — E875 Hyperkalemia: Secondary | ICD-10-CM

## 2022-07-14 DIAGNOSIS — D649 Anemia, unspecified: Secondary | ICD-10-CM

## 2022-07-14 DIAGNOSIS — R7989 Other specified abnormal findings of blood chemistry: Secondary | ICD-10-CM

## 2022-07-14 NOTE — Progress Notes (Signed)
See lab notes, plan for repeat CBC, CMP in 2 days, then depending on those results can determine further repeat testing interval.

## 2022-07-15 ENCOUNTER — Telehealth: Payer: Self-pay

## 2022-07-15 NOTE — Telephone Encounter (Signed)
-----   Message from Wendie Agreste, MD sent at 07/14/2022  7:04 PM EDT ----- Called patient. Feels a lot better.  Clear urine, no further dark urine.  No further lightheadedness.  Repeat bloodwork in next 2 days.  Please schedule lab visit for around 1pm this Friday the 4th.

## 2022-07-15 NOTE — Telephone Encounter (Signed)
Called pt to schedule lab for Friday afternoon as requested by Dr Carlota Raspberry, no answer, LM to call back and schedule

## 2022-07-16 ENCOUNTER — Other Ambulatory Visit (INDEPENDENT_AMBULATORY_CARE_PROVIDER_SITE_OTHER): Payer: BC Managed Care – PPO

## 2022-07-16 DIAGNOSIS — E875 Hyperkalemia: Secondary | ICD-10-CM

## 2022-07-16 DIAGNOSIS — D649 Anemia, unspecified: Secondary | ICD-10-CM | POA: Diagnosis not present

## 2022-07-16 DIAGNOSIS — R7989 Other specified abnormal findings of blood chemistry: Secondary | ICD-10-CM | POA: Diagnosis not present

## 2022-07-16 LAB — CBC
HCT: 39.7 % (ref 39.0–52.0)
Hemoglobin: 13.2 g/dL (ref 13.0–17.0)
MCHC: 33.2 g/dL (ref 30.0–36.0)
MCV: 89.9 fl (ref 78.0–100.0)
Platelets: 386 10*3/uL (ref 150.0–400.0)
RBC: 4.42 Mil/uL (ref 4.22–5.81)
RDW: 14.4 % (ref 11.5–15.5)
WBC: 9 10*3/uL (ref 4.0–10.5)

## 2022-07-16 LAB — COMPREHENSIVE METABOLIC PANEL
ALT: 85 U/L — ABNORMAL HIGH (ref 0–53)
AST: 54 U/L — ABNORMAL HIGH (ref 0–37)
Albumin: 3.8 g/dL (ref 3.5–5.2)
Alkaline Phosphatase: 408 U/L — ABNORMAL HIGH (ref 39–117)
BUN: 21 mg/dL (ref 6–23)
CO2: 26 mEq/L (ref 19–32)
Calcium: 9.4 mg/dL (ref 8.4–10.5)
Chloride: 102 mEq/L (ref 96–112)
Creatinine, Ser: 0.78 mg/dL (ref 0.40–1.50)
GFR: 97.53 mL/min (ref >60.00)
Glucose, Bld: 89 mg/dL (ref 70–99)
Potassium: 4.7 mEq/L (ref 3.5–5.1)
Sodium: 134 mEq/L — ABNORMAL LOW (ref 135–145)
Total Bilirubin: 1.8 mg/dL — ABNORMAL HIGH (ref 0.2–1.2)
Total Protein: 6.9 g/dL (ref 6.0–8.3)

## 2022-07-16 NOTE — Telephone Encounter (Signed)
Called lm again in attempt to schedule for repeat lab work

## 2022-08-02 ENCOUNTER — Ambulatory Visit: Payer: BC Managed Care – PPO | Admitting: Family Medicine

## 2022-08-12 ENCOUNTER — Encounter: Payer: Self-pay | Admitting: Family Medicine

## 2022-08-12 ENCOUNTER — Ambulatory Visit (INDEPENDENT_AMBULATORY_CARE_PROVIDER_SITE_OTHER): Payer: BC Managed Care – PPO | Admitting: Family Medicine

## 2022-08-12 VITALS — BP 132/72 | HR 56 | Temp 98.1°F | Ht 71.5 in | Wt 221.8 lb

## 2022-08-12 DIAGNOSIS — R7989 Other specified abnormal findings of blood chemistry: Secondary | ICD-10-CM

## 2022-08-12 DIAGNOSIS — E871 Hypo-osmolality and hyponatremia: Secondary | ICD-10-CM | POA: Diagnosis not present

## 2022-08-12 DIAGNOSIS — E785 Hyperlipidemia, unspecified: Secondary | ICD-10-CM

## 2022-08-12 DIAGNOSIS — D649 Anemia, unspecified: Secondary | ICD-10-CM | POA: Diagnosis not present

## 2022-08-12 LAB — CBC WITH DIFFERENTIAL/PLATELET
Basophils Absolute: 0.1 10*3/uL (ref 0.0–0.1)
Basophils Relative: 1 % (ref 0.0–3.0)
Eosinophils Absolute: 0.1 10*3/uL (ref 0.0–0.7)
Eosinophils Relative: 1.9 % (ref 0.0–5.0)
HCT: 44.2 % (ref 39.0–52.0)
Hemoglobin: 14.7 g/dL (ref 13.0–17.0)
Lymphocytes Relative: 22.6 % (ref 12.0–46.0)
Lymphs Abs: 1.5 10*3/uL (ref 0.7–4.0)
MCHC: 33.3 g/dL (ref 30.0–36.0)
MCV: 90.8 fl (ref 78.0–100.0)
Monocytes Absolute: 0.6 10*3/uL (ref 0.1–1.0)
Monocytes Relative: 8.9 % (ref 3.0–12.0)
Neutro Abs: 4.4 10*3/uL (ref 1.4–7.7)
Neutrophils Relative %: 65.6 % (ref 43.0–77.0)
Platelets: 224 10*3/uL (ref 150.0–400.0)
RBC: 4.86 Mil/uL (ref 4.22–5.81)
RDW: 14.5 % (ref 11.5–15.5)
WBC: 6.8 10*3/uL (ref 4.0–10.5)

## 2022-08-12 LAB — COMPREHENSIVE METABOLIC PANEL
ALT: 40 U/L (ref 0–53)
AST: 27 U/L (ref 0–37)
Albumin: 4.2 g/dL (ref 3.5–5.2)
Alkaline Phosphatase: 143 U/L — ABNORMAL HIGH (ref 39–117)
BUN: 29 mg/dL — ABNORMAL HIGH (ref 6–23)
CO2: 29 mEq/L (ref 19–32)
Calcium: 9.6 mg/dL (ref 8.4–10.5)
Chloride: 101 mEq/L (ref 96–112)
Creatinine, Ser: 0.83 mg/dL (ref 0.40–1.50)
GFR: 95.67 mL/min (ref >60.00)
Glucose, Bld: 90 mg/dL (ref 70–99)
Potassium: 5.1 mEq/L (ref 3.5–5.1)
Sodium: 135 mEq/L (ref 135–145)
Total Bilirubin: 0.8 mg/dL (ref 0.2–1.2)
Total Protein: 7.2 g/dL (ref 6.0–8.3)
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
## 2022-08-12 NOTE — Patient Instructions (Addendum)
Glad to hear you are doing well. If any concerns on labs will let you know next step. Take care

## 2022-11-01 ENCOUNTER — Telehealth: Payer: Self-pay | Admitting: *Deleted

## 2022-11-01 NOTE — Patient Outreach (Signed)
  Care Coordination   11/01/2022 Name: Jonathon Fischer MRN: [id number] DOB: September 03, 1961   Care Coordination Outreach Attempts:  An unsuccessful telephone outreach was attempted today to offer the patient information about available care coordination services as a benefit of their health plan.   Follow Up Plan:  Additional outreach attempts will be made to offer the patient care coordination information and services.   Encounter Outcome:  No Answer  Care Coordination Interventions Activated:  No   Care Coordination Interventions:  No, not indicated    Raina Mina, RN Care Management Coordinator [place] [facility] [phone]

## 2023-01-10 ENCOUNTER — Telehealth: Payer: Self-pay

## 2023-01-10 ENCOUNTER — Encounter: Payer: Self-pay | Admitting: Family Medicine

## 2023-01-10 ENCOUNTER — Ambulatory Visit: Payer: BC Managed Care – PPO | Admitting: Family Medicine

## 2023-01-10 VITALS — BP 112/70 | HR 68 | Temp 98.1°F | Resp 17 | Ht 71.5 in | Wt 216.2 lb

## 2023-01-10 DIAGNOSIS — R7989 Other specified abnormal findings of blood chemistry: Secondary | ICD-10-CM

## 2023-01-10 DIAGNOSIS — K8689 Other specified diseases of pancreas: Secondary | ICD-10-CM | POA: Diagnosis not present

## 2023-01-10 DIAGNOSIS — F418 Other specified anxiety disorders: Secondary | ICD-10-CM | POA: Diagnosis not present

## 2023-01-10 DIAGNOSIS — I10 Essential (primary) hypertension: Secondary | ICD-10-CM | POA: Diagnosis not present

## 2023-01-10 LAB — COMPREHENSIVE METABOLIC PANEL
ALT: 123 U/L — ABNORMAL HIGH (ref 0–53)
AST: 68 U/L — ABNORMAL HIGH (ref 0–37)
Albumin: 3.6 g/dL (ref 3.5–5.2)
Alkaline Phosphatase: 1023 U/L — ABNORMAL HIGH (ref 39–117)
BUN: 14 mg/dL (ref 6–23)
CO2: 27 mEq/L (ref 19–32)
Calcium: 9.3 mg/dL (ref 8.4–10.5)
Chloride: 97 mEq/L (ref 96–112)
Creatinine, Ser: 0.83 mg/dL (ref 0.40–1.50)
GFR: 95.39 mL/min (ref >60.00)
Glucose, Bld: 100 mg/dL — ABNORMAL HIGH (ref 70–99)
Potassium: 4.6 mEq/L (ref 3.5–5.1)
Sodium: 132 mEq/L — ABNORMAL LOW (ref 135–145)
Total Bilirubin: 7.5 mg/dL — ABNORMAL HIGH (ref 0.2–1.2)
Total Protein: 6.9 g/dL (ref 6.0–8.3)

## 2023-01-10 LAB — CBC
HCT: 35.9 % — ABNORMAL LOW (ref 39.0–52.0)
Hemoglobin: 12.2 g/dL — ABNORMAL LOW (ref 13.0–17.0)
MCHC: 34 g/dL (ref 30.0–36.0)
MCV: 92.1 fl (ref 78.0–100.0)
Platelets: 437 10*3/uL — ABNORMAL HIGH (ref 150.0–400.0)
RBC: 3.9 Mil/uL — ABNORMAL LOW (ref 4.22–5.81)
RDW: 14.2 % (ref 11.5–15.5)
WBC: 9.5 10*3/uL (ref 4.0–10.5)

## 2023-01-10 MED ORDER — ALPRAZOLAM 0.25 MG PO TABS: 0.2500 mg | ORAL_TABLET | Freq: Two times a day (BID) | ORAL | 0 refills | Status: AC | PRN

## 2023-01-10 MED ORDER — LISINOPRIL 10 MG PO TABS: 10.0000 mg | ORAL_TABLET | Freq: Every day | ORAL | 1 refills | Status: AC

## 2023-01-10 NOTE — Patient Outreach (Signed)
  Care Coordination TOC Note Transition Care Management Follow-up Telephone Call Date of discharge and from where: 01/09/23-[facility]  Dx: "generalized abd pain" How have you been since you were released from the hospital? Patient states he is doing and feeling better. He completed PCP follow up appt a short while ago. MD has adjusted his meds and he will pick them up later today. He reports appetite has been fine. Denies any GI sxs or issues a present.  Any questions or concerns? No  Items Reviewed: Did the pt receive and understand the discharge instructions provided? Yes  Medications obtained and verified?  Patient just reviewed meds with MD during appt Other? No  Any new allergies since your discharge? No  Dietary orders reviewed? Yes Do you have support at home? Yes   Home Care and Equipment/Supplies: Were home health services ordered? not applicable If so, what is the name of the agency? N/A  Has the agency set up a time to come to the patient's home? not applicable Were any new equipment or medical supplies ordered?  No What is the name of the medical supply agency? N/A Were you able to get the supplies/equipment? not applicable Do you have any questions related to the use of the equipment or supplies? No  Functional Questionnaire: (I = Independent and D = Dependent) ADLs: I  Bathing/Dressing- I  Meal Prep- I  Eating- I  Maintaining continence- I  Transferring/Ambulation- I  Managing Meds- I  Follow up appointments reviewed:  PCP Hospital f/u appt confirmed? Yes  -Patient saw PCP earlier today [facility] f/u appt confirmed?  Patient voices office will call him with appt-he is ware to follow up if he has not heard from them in a few days   Are transportation arrangements needed? No  If their condition worsens, is the pt aware to call PCP or go to the Emergency Dept.? Yes Was the patient provided with contact information for the PCP's office or ED? Yes Was  to pt encouraged to call back with questions or concerns? Yes  SDOH assessments and interventions completed:   Yes SDOH Interventions Today    Flowsheet Row Most Recent Value  SDOH Interventions   Food Insecurity Interventions Intervention Not Indicated  Transportation Interventions Intervention Not Indicated       Care Coordination Interventions:  Education provided    Encounter Outcome:  Pt. Visit Completed    Enzo Montgomery, RN,BSN,CCM [facility] Telephonic Care Management Coordinator Direct Phone: [phone] Toll Free: [phone] Fax: [phone]

## 2023-01-10 NOTE — Patient Instructions (Addendum)
Try lisinopril '10mg'$  per day. Continue to avoid alcohol.  Xanax only if needed for temporary anxiety, or difficulty with sleep.  See info below for some other ways to help cope with difficult news, but please let me know if I can help further. Recheck in 2 weeks, but I will keep an eye out for any updates.  Hang in there.  Remember I am available through MyChart or happy to call if needed.
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
## 2023-01-12 ENCOUNTER — Encounter: Payer: Self-pay | Admitting: Family Medicine

## 2023-01-12 ENCOUNTER — Ambulatory Visit: Payer: BC Managed Care – PPO | Admitting: Family Medicine

## 2023-01-12 DIAGNOSIS — K8689 Other specified diseases of pancreas: Secondary | ICD-10-CM

## 2023-01-12 DIAGNOSIS — R7989 Other specified abnormal findings of blood chemistry: Secondary | ICD-10-CM

## 2023-01-12 MED ORDER — OXYCODONE HCL 5 MG PO TABS: 5.0000 mg | ORAL_TABLET | ORAL | 0 refills | Status: DC | PRN

## 2023-01-14 ENCOUNTER — Other Ambulatory Visit: Payer: BC Managed Care – PPO

## 2023-01-14 DIAGNOSIS — R7989 Other specified abnormal findings of blood chemistry: Secondary | ICD-10-CM

## 2023-01-20 ENCOUNTER — Telehealth: Payer: Self-pay | Admitting: Family Medicine

## 2023-01-20 NOTE — Telephone Encounter (Signed)
Pt is on th

## 2023-01-24 NOTE — Telephone Encounter (Signed)
Called patient. Doing ok. Having to take about 2 oxycodone per day at times - up to 2 per day.  Some pain on left side. Unable to do chemo yet as elevated bili - plans to start next Monday. Has been approved for short term disability. Physical job. Has appt with surgeon tomorrow - cancelled. Portal placement in 2 days.  If chemo does not work, then 4-6 months estimated survival time, but is planning chemo.  Counseling service will be calling him for appt.   Denies any acute needs from me at this time. Has appt with me in 4 days.

## 2023-01-24 NOTE — Telephone Encounter (Signed)
Pt giving an update

## 2023-01-26 ENCOUNTER — Ambulatory Visit: Payer: BC Managed Care – PPO | Admitting: Family Medicine

## 2023-01-28 ENCOUNTER — Encounter: Payer: Self-pay | Admitting: Family Medicine

## 2023-01-28 ENCOUNTER — Ambulatory Visit (INDEPENDENT_AMBULATORY_CARE_PROVIDER_SITE_OTHER): Payer: BC Managed Care – PPO | Admitting: Family Medicine

## 2023-01-28 VITALS — BP 140/74 | HR 63 | Ht 71.5 in | Wt 224.5 lb

## 2023-01-28 DIAGNOSIS — C259 Malignant neoplasm of pancreas, unspecified: Secondary | ICD-10-CM

## 2023-01-28 DIAGNOSIS — K8689 Other specified diseases of pancreas: Secondary | ICD-10-CM | POA: Diagnosis not present

## 2023-01-28 MED ORDER — OXYCODONE HCL 5 MG PO TABS: 5.0000 mg | ORAL_TABLET | ORAL | 0 refills | Status: AC | PRN

## 2023-01-28 NOTE — Patient Instructions (Addendum)
I can certainly refill oxycodone if needed, but would recommend your oncologist team manage that medication. Make sure not to combine with xanax.  If you would like other numbers for counseling please let me know. Keep follow up with your specialists. I will keep an eye out on your notes, but let me know if there is anything you need from me. Hang in there.
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
## 2023-03-02 ENCOUNTER — Ambulatory Visit (INDEPENDENT_AMBULATORY_CARE_PROVIDER_SITE_OTHER): Payer: BC Managed Care – PPO | Admitting: Family Medicine

## 2023-03-02 ENCOUNTER — Encounter: Payer: Self-pay | Admitting: Family Medicine

## 2023-03-02 VITALS — BP 136/74 | HR 74 | Temp 98.8°F | Ht 71.0 in | Wt 232.2 lb

## 2023-03-02 DIAGNOSIS — I10 Essential (primary) hypertension: Secondary | ICD-10-CM | POA: Diagnosis not present

## 2023-03-02 DIAGNOSIS — C259 Malignant neoplasm of pancreas, unspecified: Secondary | ICD-10-CM | POA: Diagnosis not present

## 2023-03-02 DIAGNOSIS — F418 Other specified anxiety disorders: Secondary | ICD-10-CM
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
## 2023-03-02 NOTE — Patient Instructions (Signed)
Blood pressure looks ok today.   Ok to continue same meds for sleep if needed, but I would recommend meeting with therapist through [facility]. Please let me know if other numbers needed. If continued symptoms of feeling down/depressed I would recommend a daily medicine.   Let me know if there are questions prior to next visit. Hang in there.
